# Patient Record
Sex: Male | Born: 1950
Health system: Southern US, Community
[De-identification: ages and names within clinical notes are randomized; demographics above are authoritative.]

## PROBLEM LIST (undated history)

## (undated) DIAGNOSIS — F32A Depression, unspecified: Secondary | ICD-10-CM

## (undated) DIAGNOSIS — M109 Gout, unspecified: Secondary | ICD-10-CM

## (undated) DIAGNOSIS — G4730 Sleep apnea, unspecified: Secondary | ICD-10-CM

## (undated) DIAGNOSIS — E669 Obesity, unspecified: Secondary | ICD-10-CM

## (undated) DIAGNOSIS — H269 Unspecified cataract: Secondary | ICD-10-CM

## (undated) DIAGNOSIS — K219 Gastro-esophageal reflux disease without esophagitis: Secondary | ICD-10-CM

## (undated) DIAGNOSIS — I340 Nonrheumatic mitral (valve) insufficiency: Secondary | ICD-10-CM

## (undated) DIAGNOSIS — E785 Hyperlipidemia, unspecified: Secondary | ICD-10-CM

## (undated) DIAGNOSIS — F329 Major depressive disorder, single episode, unspecified: Secondary | ICD-10-CM

## (undated) DIAGNOSIS — K279 Peptic ulcer, site unspecified, unspecified as acute or chronic, without hemorrhage or perforation: Secondary | ICD-10-CM

## (undated) DIAGNOSIS — B0050 Herpesviral ocular disease, unspecified: Secondary | ICD-10-CM

## (undated) DIAGNOSIS — R011 Cardiac murmur, unspecified: Secondary | ICD-10-CM

## (undated) DIAGNOSIS — K449 Diaphragmatic hernia without obstruction or gangrene: Secondary | ICD-10-CM

## (undated) DIAGNOSIS — M5430 Sciatica, unspecified side: Secondary | ICD-10-CM

## (undated) DIAGNOSIS — T7840XA Allergy, unspecified, initial encounter: Secondary | ICD-10-CM

## (undated) DIAGNOSIS — M1990 Unspecified osteoarthritis, unspecified site: Secondary | ICD-10-CM

## (undated) DIAGNOSIS — I10 Essential (primary) hypertension: Secondary | ICD-10-CM

## (undated) HISTORY — DX: Unspecified osteoarthritis, unspecified site: M19.90

## (undated) HISTORY — DX: Herpesviral ocular disease, unspecified: B00.50

## (undated) HISTORY — DX: Major depressive disorder, single episode, unspecified: F32.9

## (undated) HISTORY — DX: Sleep apnea, unspecified: G47.30

## (undated) HISTORY — PX: COLONOSCOPY: SHX174

## (undated) HISTORY — DX: Diaphragmatic hernia without obstruction or gangrene: K44.9

## (undated) HISTORY — DX: Gout, unspecified: M10.9

## (undated) HISTORY — DX: Hyperlipidemia, unspecified: E78.5

## (undated) HISTORY — DX: Depression, unspecified: F32.A

## (undated) HISTORY — DX: Obesity, unspecified: E66.9

## (undated) HISTORY — DX: Peptic ulcer, site unspecified, unspecified as acute or chronic, without hemorrhage or perforation: K27.9

## (undated) HISTORY — DX: Allergy, unspecified, initial encounter: T78.40XA

## (undated) HISTORY — DX: Sciatica, unspecified side: M54.30

## (undated) HISTORY — DX: Cardiac murmur, unspecified: R01.1

## (undated) HISTORY — DX: Unspecified cataract: H26.9

## (undated) HISTORY — DX: Essential (primary) hypertension: I10

## (undated) HISTORY — DX: Gastro-esophageal reflux disease without esophagitis: K21.9

## (undated) HISTORY — PX: POLYPECTOMY: SHX149

## (undated) HISTORY — DX: Nonrheumatic mitral (valve) insufficiency: I34.0

---

## 2001-03-07 ENCOUNTER — Emergency Department (HOSPITAL_COMMUNITY): Admission: EM | Admit: 2001-03-07 | Discharge: 2001-03-08 | Payer: Self-pay | Admitting: Emergency Medicine

## 2002-09-20 HISTORY — PX: MITRAL VALVE REPAIR: SHX2039

## 2002-10-19 ENCOUNTER — Inpatient Hospital Stay (HOSPITAL_COMMUNITY): Admission: EM | Admit: 2002-10-19 | Discharge: 2002-11-06 | Payer: Self-pay | Admitting: Emergency Medicine

## 2002-10-19 ENCOUNTER — Encounter: Payer: Self-pay | Admitting: Internal Medicine

## 2002-10-20 ENCOUNTER — Encounter: Payer: Self-pay | Admitting: Internal Medicine

## 2002-10-30 ENCOUNTER — Encounter: Payer: Self-pay | Admitting: Cardiothoracic Surgery

## 2002-10-31 ENCOUNTER — Encounter: Payer: Self-pay | Admitting: Cardiothoracic Surgery

## 2002-11-01 ENCOUNTER — Encounter: Payer: Self-pay | Admitting: Cardiothoracic Surgery

## 2002-11-05 ENCOUNTER — Encounter: Payer: Self-pay | Admitting: Cardiothoracic Surgery

## 2002-12-10 ENCOUNTER — Encounter (HOSPITAL_COMMUNITY): Admission: RE | Admit: 2002-12-10 | Discharge: 2003-03-10 | Payer: Self-pay | Admitting: Cardiology

## 2004-09-20 HISTORY — PX: KNEE ARTHROSCOPY: SUR90

## 2010-05-29 DIAGNOSIS — J309 Allergic rhinitis, unspecified: Secondary | ICD-10-CM | POA: Insufficient documentation

## 2011-05-28 ENCOUNTER — Encounter: Payer: Self-pay | Admitting: Internal Medicine

## 2011-12-08 ENCOUNTER — Encounter: Payer: Self-pay | Admitting: *Deleted

## 2012-02-18 ENCOUNTER — Encounter: Payer: Self-pay | Admitting: Cardiology

## 2012-06-21 ENCOUNTER — Encounter: Payer: Self-pay | Admitting: Internal Medicine

## 2013-01-24 ENCOUNTER — Encounter: Payer: Self-pay | Admitting: Cardiology

## 2014-08-07 ENCOUNTER — Encounter: Payer: Self-pay | Admitting: Internal Medicine

## 2014-09-18 ENCOUNTER — Ambulatory Visit (AMBULATORY_SURGERY_CENTER): Payer: Self-pay | Admitting: *Deleted

## 2014-09-18 VITALS — Ht 67.0 in | Wt 248.0 lb

## 2014-09-18 DIAGNOSIS — Z1211 Encounter for screening for malignant neoplasm of colon: Secondary | ICD-10-CM

## 2014-09-18 MED ORDER — MOVIPREP 100 G PO SOLR: 1.0000 | Freq: Once | ORAL | Status: DC

## 2014-09-18 NOTE — Progress Notes (Signed)
No egg or soy allergy. No anesthesia problems.  No home O2.  No diet meds.  

## 2014-10-02 ENCOUNTER — Encounter: Payer: Self-pay | Admitting: Internal Medicine

## 2014-10-02 ENCOUNTER — Ambulatory Visit (AMBULATORY_SURGERY_CENTER): Payer: BLUE CROSS/BLUE SHIELD | Admitting: Internal Medicine

## 2014-10-02 VITALS — BP 123/85 | HR 68 | Temp 97.8°F | Resp 15 | Ht 67.0 in | Wt 248.0 lb

## 2014-10-02 DIAGNOSIS — D122 Benign neoplasm of ascending colon: Secondary | ICD-10-CM

## 2014-10-02 DIAGNOSIS — D123 Benign neoplasm of transverse colon: Secondary | ICD-10-CM

## 2014-10-02 DIAGNOSIS — D124 Benign neoplasm of descending colon: Secondary | ICD-10-CM

## 2014-10-02 DIAGNOSIS — Z1211 Encounter for screening for malignant neoplasm of colon: Secondary | ICD-10-CM

## 2014-10-02 MED ORDER — SODIUM CHLORIDE 0.9 % IV SOLN: 500.0000 mL | INTRAVENOUS | Status: DC

## 2014-10-02 NOTE — Op Note (Signed)
[place]  [address] [employer] [address]. [place], [address]   COLONOSCOPY PROCEDURE REPORT  PATIENT: Luis Boone, Luis Boone  MR#: [id number] BIRTHDATE: Feb 25, 1951 , 20  yrs. old GENDER: male ENDOSCOPIST: Eustace Quail, MD REFERRED NG:[id number] Recall, PROCEDURE DATE:  10/02/2014 PROCEDURE:   Colonoscopy with snare polypectomy x 7 First Screening Colonoscopy - Avg.  risk and is 50 yrs.  old or older - No.  Prior Negative Screening - Now for repeat screening. 10 or more years since last screening  History of Adenoma - Now for follow-up colonoscopy & has been > or = to 3 yrs.  N/A  Polyps Removed Today? Yes. ASA CLASS:   Class II INDICATIONS:average risk for colorectal cancer. . Index exam August 2005 was normal MEDICATIONS: Monitored anesthesia care and Propofol 300 mg IV DESCRIPTION OF PROCEDURE:   After the risks benefits and alternatives of the procedure were thoroughly explained, informed consent was obtained.  The digital rectal exam revealed no abnormalities of the rectum.   The [id number]  endoscope was introduced through the anus and advanced to the cecum, which was identified by both the appendix and ileocecal valve. No adverse events experienced.   The quality of the prep was excellent, using MoviPrep  The instrument was then slowly withdrawn as the colon was fully examined.  COLON FINDINGS: Seven polyps were found in the descending colon, transverse colon, and ascending colon.  A polypectomy was performed with a cold snare.  The resection was complete, the polyp tissue was completely retrieved and sent to histology.   The examination was otherwise normal.  Retroflexed views revealed internal hemorrhoids. The time to cecum=3 minutes 46 seconds.  Withdrawal time=17 minutes 58 seconds.  The scope was withdrawn and the procedure completed. COMPLICATIONS: There were no immediate complications. ENDOSCOPIC IMPRESSION: 1.   Seven polyps were found in the colon;  polypectomy was performed with a cold snare 2.   The examination was otherwise normal RECOMMENDATIONS: 1. Repeat Colonoscopy in 3 years.  eSigned:  Eustace Quail, MD 10/02/2014 1:59 PM cc: Prince Solian, MD and The Patient   PATIENT NAME:  Luis Boone, Luis Boone MR#: [id number]

## 2014-10-02 NOTE — Progress Notes (Signed)
Called to room to assist during endoscopic procedure.  Patient ID and intended procedure confirmed with present staff. Received instructions for my participation in the procedure from the performing physician.  

## 2014-10-02 NOTE — Progress Notes (Signed)
Report to PACU, RN, vss, BBS= Clear.  

## 2014-10-02 NOTE — Progress Notes (Signed)
No problems noted in the recovery room. maw 

## 2014-10-02 NOTE — Patient Instructions (Signed)

## 2014-10-03 ENCOUNTER — Telehealth: Payer: Self-pay | Admitting: *Deleted

## 2014-10-03 NOTE — Telephone Encounter (Signed)
  Follow up Call-  Call back number 10/02/2014  Post procedure Call Back phone  # [phone]  Permission to leave phone message Yes    [facility]

## 2014-10-08 ENCOUNTER — Encounter: Payer: Self-pay | Admitting: Internal Medicine

## 2016-08-31 ENCOUNTER — Encounter: Payer: Self-pay | Admitting: Neurology

## 2016-08-31 ENCOUNTER — Ambulatory Visit (INDEPENDENT_AMBULATORY_CARE_PROVIDER_SITE_OTHER): Payer: BLUE CROSS/BLUE SHIELD | Admitting: Neurology

## 2016-08-31 VITALS — BP 117/80 | HR 79 | Resp 20 | Ht 67.0 in | Wt 258.0 lb

## 2016-08-31 DIAGNOSIS — R0681 Apnea, not elsewhere classified: Secondary | ICD-10-CM

## 2016-08-31 DIAGNOSIS — R400 Somnolence: Secondary | ICD-10-CM | POA: Diagnosis not present

## 2016-08-31 DIAGNOSIS — G4733 Obstructive sleep apnea (adult) (pediatric): Secondary | ICD-10-CM | POA: Diagnosis not present

## 2016-08-31 DIAGNOSIS — R351 Nocturia: Secondary | ICD-10-CM

## 2016-08-31 DIAGNOSIS — R51 Headache: Secondary | ICD-10-CM

## 2016-08-31 DIAGNOSIS — R519 Headache, unspecified: Secondary | ICD-10-CM

## 2016-08-31 DIAGNOSIS — G478 Other sleep disorders: Secondary | ICD-10-CM

## 2016-08-31 DIAGNOSIS — R0683 Snoring: Secondary | ICD-10-CM

## 2016-08-31 NOTE — Progress Notes (Signed)
Subjective:    Patient ID: Luis Boone is a 65 y.o. male.  HPI     Star Age, MD, PhD [facility] [address] [place], [place] [address]  Dear Dr. Dagmar Hait,   I saw your patient, Luis Boone, upon your kind request in my neurologic clinic today for initial consultation of his sleep disorder, in particular, concern for underlying obstructive sleep apnea. The patient is unaccompanied today. As you know, Luis Boone is a 65 year old right-handed gentleman with an underlying medical history of hypertension, hyperlipidemia, hiatal hernia, peptic ulcer disease, depression, gout, mitral valve repair, arthritis and status post arthroscopic knee surgeries, as well as obesity, who reports snoring and excessive daytime somnolence as well as morning headaches. I reviewed your office note from 08/26/2016, which you kindly included. 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 He has 2 grown children (one adopted, one biological). He does not smoke or drink alcohol. He is going to retire by the end of this year. Bedtime is about 10:00 to 10:30 PM, and likes to read on his [place], is asleep latest by 11 PM.  His Rise time is 5:15 AM. He does not always wake up rested. He has had fairly frequent morning headaches in the past few months, snoring has become worse in the past 12-18 months and he has also been noted to sleep talking the past 12 months per wife's report. His Epworth sleepiness score is 15 out of 24 today, his fatigue score is 41 out of 63. His sister may have obstructive sleep apnea and uses a CPAP machine as far as he can recall. He would be willing to try CPAP therapy. He denies restless legs symptoms and is not sure if he twitches or kicks his legs in his sleep. He has nocturia about 2-3 times per average night. Weight has been fluctuating. He is working on weight loss. His dentist made him aware that his airway is crowded  and small and that he may need to be tested for sleep apnea. He had recent blood work in your office on 08/19/2016 and I reviewed the results. PSA was 0.502, lipid profile was good with total cholesterol at 114, LDL 55, HDL 44, liver function test normal, BUN and creatinine normal.  He is a nonsmoker, does not drink alcohol, drinks caffeine in limitation, usually one per day in the morning in the form of coffee.   His Past Medical History Is Significant For: Past Medical History:  Diagnosis Date  . Allergy    seasonal  . Arthritis   . Depression   . GERD (gastroesophageal reflux disease)   . Gout   . Gout   . Heart murmur    heart valve repair  . Hiatal hernia   . HLD (hyperlipidemia)   . Hypertension   . Mitral murmur   . Obesity   . Ocular herpes simplex   . PUD (peptic ulcer disease)     His Past Surgical History Is Significant For: Past Surgical History:  Procedure Laterality Date  . COLONOSCOPY    . KNEE ARTHROSCOPY Right 2006  . MITRAL VALVE REPAIR  2004    His Family History Is Significant For: Family History  Problem Relation Age of Onset  . Migraines Mother   . Hypertension Mother   . Dementia Mother   . Cancer Father   . Arthritis Father   . Heart attack Father   .  Colon cancer Neg Hx     His Social History Is Significant For: Social History   Social History  . Marital status: Unknown    Spouse name: N/A  . Number of children: N/A  . Years of education: N/A   Social History Main Topics  . Smoking status: Never Smoker  . Smokeless tobacco: Never Used  . Alcohol use No  . Drug use: No  . Sexual activity: Not Asked   Other Topics Concern  . None   Social History Narrative  . None    His Allergies Are:  Allergies  Allergen Reactions  . Prilosec [Omeprazole] Rash  . Vicodin [Hydrocodone-Acetaminophen] Rash  . Other     Medication to sedate him for a colonoscopy.  :   His Current Medications Are:  Outpatient Encounter Prescriptions as  of 08/31/2016  Medication Sig  . allopurinol (ZYLOPRIM) 100 MG tablet Take 100 mg by mouth daily.  Marland Kitchen aspirin 81 MG tablet Take 81 mg by mouth daily.  Marland Kitchen atorvastatin (LIPITOR) 40 MG tablet Take 40 mg by mouth daily.  . colchicine (COLCRYS) 0.6 MG tablet Take 0.6 mg by mouth daily.  Marland Kitchen loratadine (CLARITIN) 10 MG tablet Take 10 mg by mouth daily.  . metoprolol succinate (TOPROL-XL) 25 MG 24 hr tablet Take 25 mg by mouth daily.  . [DISCONTINUED] montelukast (SINGULAIR) 10 MG tablet Take 10 mg by mouth at bedtime.  . [DISCONTINUED] omega-3 acid ethyl esters (LOVAZA) 1 G capsule Take 1 g by mouth daily.   No facility-administered encounter medications on file as of 08/31/2016.   :  Review of Systems:  Out of a complete 14 point review of systems, all are reviewed and negative with the exception of these symptoms as listed below:  Review of Systems  Neurological: Positive for headaches.       Pt presents today to discuss the possibility of having a sleep study. Pt says that both his dentist and Dr. Dagmar Hait recommended that he have a sleep study. Pt reports snoring but has never had a sleep study.  Epworth Sleepiness Scale 0= would never doze 1= slight chance of dozing 2= moderate chance of dozing 3= high chance of dozing  Sitting and reading: 2 Watching TV: 3 Sitting inactive in a public place (ex. Theater or meeting): 2 As a passenger in a car for an hour without a break: 1 Lying down to rest in the afternoon: 3 Sitting and talking to someone: 1 Sitting quietly after lunch (no alcohol): 2 In a car, while stopped in traffic: 1 Total: 15   Psychiatric/Behavioral: Positive for sleep disturbance.    Objective:  Neurologic Exam  Physical Exam Physical Examination:   Vitals:   08/31/16 1614  BP: 117/80  Pulse: 79  Resp: 20   General Examination: The patient is a very pleasant 65 y.o. male in no acute distress. He appears well-developed and well-nourished and well groomed.    HEENT: Normocephalic, atraumatic, pupils are equal, round and reactive to light and accommodation. Funduscopic exam is normal with sharp disc margins noted. Extraocular tracking is good without limitation to gaze excursion or nystagmus noted. Normal smooth pursuit is noted. Hearing is grossly intact. Face is symmetric with normal facial animation and normal facial sensation. Speech is clear with no dysarthria noted. There is no hypophonia. There is no lip, neck/head, jaw or voice tremor. Neck is supple with full range of passive and active motion. There are no carotid bruits on auscultation. Oropharynx exam reveals: mild mouth  dryness, adequate dental hygiene and marked airway crowding, due to small airway and thicker tongue, large uvula, tonsils about 1+, could not fully visualize. Mallampati is class III. Tongue protrudes centrally and palate elevates symmetrically. Neck size is 18.5 inches. He has a Marked overbite. Nasal inspection reveals no significant nasal mucosal bogginess or redness and no septal deviation.   Chest: Clear to auscultation without wheezing, rhonchi or crackles noted.  Heart: S1+S2+0, regular and normal without murmurs, rubs or gallops noted.   Abdomen: Soft, non-tender and non-distended with normal bowel sounds appreciated on auscultation.  Extremities: There is no pitting edema in the distal lower extremities bilaterally. Pedal pulses are intact.  Skin: Warm and dry without trophic changes noted. There are no varicose veins.  Musculoskeletal: exam reveals no obvious joint deformities, tenderness or joint swelling or erythema.   Neurologically:  Mental status: The patient is awake, alert and oriented in all 4 spheres. His immediate and remote memory, attention, language skills and fund of knowledge are appropriate. There is no evidence of aphasia, agnosia, apraxia or anomia. Speech is clear with normal prosody and enunciation. Thought process is linear. Mood is normal and  affect is normal.  Cranial nerves II - XII are as described above under HEENT exam. In addition: shoulder shrug is normal with equal shoulder height noted. Motor exam: Normal bulk, strength and tone is noted. There is no drift, tremor or rebound. Romberg is negative. Reflexes are 2+ throughout. Fine motor skills and coordination: intact with normal finger taps, normal hand movements, normal rapid alternating patting, normal foot taps and normal foot agility.  Cerebellar testing: No dysmetria or intention tremor on finger to nose testing. Heel to shin is unremarkable bilaterally. There is no truncal or gait ataxia.  Sensory exam: intact to light touch, pinprick, vibration, temperature sense in the upper and lower extremities.  Gait, station and balance: He stands easily. No veering to one side is noted. No leaning to one side is noted. Posture is age-appropriate and stance is narrow based. Gait shows normal stride length and normal pace. No problems turning are noted. Tandem walk is unremarkable.   Assessment and Plan:   In summary, Luis Boone is a very pleasant 65 y.o.-year old male with a history and physical exam concerning for obstructive sleep apnea (OSA). I had a long chat with the patient about my findings and the diagnosis of OSA, its prognosis and treatment options. We talked about medical treatments, surgical interventions and non-pharmacological approaches. I explained in particular the risks and ramifications of untreated moderate to severe OSA, especially with respect to developing cardiovascular disease down the Road, including congestive heart failure, difficult to treat hypertension, cardiac arrhythmias, or stroke. Even type 2 diabetes has, in part, been linked to untreated OSA. Symptoms of untreated OSA include daytime sleepiness, memory problems, mood irritability and mood disorder such as depression and anxiety, lack of energy, as well as recurrent headaches, especially morning  headaches. We talked about trying to maintain a healthy lifestyle in general, as well as the importance of weight control. I encouraged the patient to eat healthy, exercise daily and keep well hydrated, to keep a scheduled bedtime and wake time routine, to not skip any meals and eat healthy snacks in between meals. I advised the patient not to drive when feeling sleepy. I recommended the following at this time: sleep study with potential positive airway pressure titration. (We will score hypopneas at 3% and split the sleep study into diagnostic and treatment portion,  if the estimated. 2 hour AHI is >15/h).   I explained the sleep test procedure to the patient and also outlined possible surgical and non-surgical treatment options of OSA, including the use of a custom-made dental device (which would require a referral to a specialist dentist or oral surgeon), upper airway surgical options, such as pillar implants, radiofrequency surgery, tongue base surgery, and UPPP (which would involve a referral to an ENT surgeon). Rarely, jaw surgery such as mandibular advancement may be considered.  I also explained the CPAP treatment option to the patient, who indicated that he would be willing to try CPAP if the need arises. I explained the importance of being compliant with PAP treatment, not only for insurance purposes but primarily to improve His symptoms, and for the patient's long term health benefit, including to reduce His cardiovascular risks. I answered all his questions today and the patient was in agreement. I would like to see him back after the sleep study is completed and encouraged him to call with any interim questions, concerns, problems or updates.   Thank you very much for allowing me to participate in the care of this nice patient. If I can be of any further assistance to you please do not hesitate to call me at [phone].   Sincerely,   Star Age, MD, PhD

## 2016-08-31 NOTE — Patient Instructions (Signed)

## 2016-09-29 ENCOUNTER — Telehealth: Payer: Self-pay | Admitting: Neurology

## 2016-09-29 DIAGNOSIS — G4733 Obstructive sleep apnea (adult) (pediatric): Secondary | ICD-10-CM

## 2016-09-29 DIAGNOSIS — R0681 Apnea, not elsewhere classified: Secondary | ICD-10-CM

## 2016-09-29 DIAGNOSIS — R351 Nocturia: Secondary | ICD-10-CM

## 2016-09-29 DIAGNOSIS — R51 Headache: Secondary | ICD-10-CM

## 2016-09-29 DIAGNOSIS — R0683 Snoring: Secondary | ICD-10-CM

## 2016-09-29 DIAGNOSIS — R519 Headache, unspecified: Secondary | ICD-10-CM

## 2016-09-29 DIAGNOSIS — G478 Other sleep disorders: Secondary | ICD-10-CM

## 2016-09-29 DIAGNOSIS — R400 Somnolence: Secondary | ICD-10-CM

## 2016-09-29 NOTE — Telephone Encounter (Signed)
Order placed

## 2016-09-29 NOTE — Telephone Encounter (Signed)
[employer] denied Split.  Can I get an order for HST? °

## 2016-10-01 DIAGNOSIS — N183 Chronic kidney disease, stage 3 (moderate): Secondary | ICD-10-CM | POA: Diagnosis not present

## 2016-10-01 DIAGNOSIS — I10 Essential (primary) hypertension: Secondary | ICD-10-CM | POA: Diagnosis not present

## 2016-10-01 DIAGNOSIS — E669 Obesity, unspecified: Secondary | ICD-10-CM | POA: Diagnosis not present

## 2016-10-01 DIAGNOSIS — Z9889 Other specified postprocedural states: Secondary | ICD-10-CM | POA: Diagnosis not present

## 2016-11-01 DIAGNOSIS — H524 Presbyopia: Secondary | ICD-10-CM | POA: Diagnosis not present

## 2016-11-01 DIAGNOSIS — H2513 Age-related nuclear cataract, bilateral: Secondary | ICD-10-CM | POA: Diagnosis not present

## 2016-11-01 DIAGNOSIS — H1789 Other corneal scars and opacities: Secondary | ICD-10-CM | POA: Diagnosis not present

## 2016-11-01 DIAGNOSIS — H25012 Cortical age-related cataract, left eye: Secondary | ICD-10-CM | POA: Diagnosis not present

## 2016-11-08 ENCOUNTER — Telehealth: Payer: Self-pay | Admitting: Neurology

## 2016-11-08 DIAGNOSIS — G478 Other sleep disorders: Secondary | ICD-10-CM

## 2016-11-08 DIAGNOSIS — G4733 Obstructive sleep apnea (adult) (pediatric): Secondary | ICD-10-CM

## 2016-11-08 DIAGNOSIS — R0681 Apnea, not elsewhere classified: Secondary | ICD-10-CM

## 2016-11-08 DIAGNOSIS — R400 Somnolence: Secondary | ICD-10-CM

## 2016-11-08 DIAGNOSIS — R51 Headache: Secondary | ICD-10-CM

## 2016-11-08 DIAGNOSIS — R519 Headache, unspecified: Secondary | ICD-10-CM

## 2016-11-08 DIAGNOSIS — R351 Nocturia: Secondary | ICD-10-CM

## 2016-11-08 DIAGNOSIS — R0683 Snoring: Secondary | ICD-10-CM

## 2016-11-08 NOTE — Telephone Encounter (Signed)
There is already an order for an HST from 09/29/2016 since the split night was denied.

## 2016-11-08 NOTE — Telephone Encounter (Signed)
I spoke to [facility]. She reports that pt changed insurances and now they will only approve a split night. Will re-order the original split night order, per Dr. Guadelupe Sabin order.

## 2016-11-08 NOTE — Telephone Encounter (Signed)
Patient called to schedule but I don't have an order in my workqueue.  I did read the notes and scheduled the patient for a split.  Can I get an order for a sleep study.

## 2016-11-14 ENCOUNTER — Ambulatory Visit (INDEPENDENT_AMBULATORY_CARE_PROVIDER_SITE_OTHER): Payer: PPO | Admitting: Neurology

## 2016-11-14 DIAGNOSIS — G4733 Obstructive sleep apnea (adult) (pediatric): Secondary | ICD-10-CM | POA: Diagnosis not present

## 2016-11-14 DIAGNOSIS — G4720 Circadian rhythm sleep disorder, unspecified type: Secondary | ICD-10-CM

## 2016-11-15 ENCOUNTER — Telehealth: Payer: Self-pay

## 2016-11-15 NOTE — Addendum Note (Signed)
Addended by: Star Age on: 11/15/2016 08:30 AM   Modules accepted: Orders

## 2016-11-15 NOTE — Telephone Encounter (Signed)
I called pt. I advised him of his sleep study results. Pt is agreeable to starting a cpap at home. I advised pt that I will send the order for cpap to a DME, [facility], and they will call him to get his cpap set up. I reviewed cpap compliance expectations with the pt. A follow up appt was made with the pt for 02/01/2017 at 2:00pm. Pt verbalized understanding of results. Pt had no questions at this time but was encouraged to call back if questions arise.

## 2016-11-15 NOTE — Telephone Encounter (Signed)
-----   Message from Star Age, MD sent at 11/15/2016  8:29 AM EST ----- Luis Boone:  Patient referred by Dr. Dagmar Hait, seen by me on 08/31/16, split study on 11/14/16. Please call and notify patient that the recent sleep study confirmed the diagnosis of severe OSA. He did well with CPAP during the study with significant improvement of the respiratory events. Therefore, I would like start the patient on CPAP therapy at home by prescribing a machine for home use. I placed the order in the chart. The patient will need a follow up appointment with me in 8 to 10 weeks post set up that has to be scheduled; please go ahead and schedule while you have the patient on the phone and make sure patient understands the importance of keeping this window for the FU appointment, as it is often an insurance requirement and failing to adhere to this may result in losing coverage for sleep apnea treatment.  Please re-enforce the importance of compliance with treatment and the need for [place] to monitor compliance data - again an insurance requirement and good feedback for the patient as far as how they are doing.  Also remind patient, that any upcoming CPAP machine or mask issues, should be first addressed with the DME company. Please ask if patient has a preference regarding DME company.  Please arrange for CPAP set up at home through a DME company of patient's choice - once you have spoken to the patient - and faxed/routed report to PCP and referring MD (if other than PCP), you can close this encounter, thanks,   Star Age, MD, PhD [facility] ([place] [address])

## 2016-11-15 NOTE — Procedures (Signed)
PATIENT'S NAME:  Luis Boone, Luis Boone DOB:      20-Oct-1950      MR#:    [id number]     DATE OF RECORDING: 11/14/2016 REFERRING M.D.:  Michaell Cowing, MD Study Performed:  Split-Night Titration Study HISTORY: 66 year old man with a history of hypertension, hyperlipidemia, hiatal hernia, peptic ulcer disease, depression, gout, mitral valve repair, arthritis and status post arthroscopic knee surgeries, as well as obesity, who reports snoring and excessive daytime somnolence as well as morning headaches. His Epworth sleepiness score is 15 out of 24, his fatigue score is 41 out of 63. Patient's weight 258 pounds with a height of 67 (inches), resulting in a BMI of 40.5 kg/m2. The patient's neck circumference measured 18.5 inches.  CURRENT MEDICATIONS: Zyloprim, ASA 81mg , Lipitor, Colcrys, Claritin, Toprol-XL,   PROCEDURE:  This is a multichannel digital polysomnogram utilizing the Somnostar 11.2 system.  Electrodes and sensors were applied and monitored per AASM Specifications.   EEG, EOG, Chin and Limb EMG, were sampled at 200 Hz.  ECG, Snore and Nasal Pressure, Thermal Airflow, Respiratory Effort, CPAP Flow and Pressure, Oximetry was sampled at 50 Hz. Digital video and audio were recorded.      BASELINE STUDY WITHOUT CPAP RESULTS:  Lights Out was at 20:57 and Lights On at 04:32 for the night.  Total recording time (TRT) was 167, with a total sleep time (TST) of 122.5 minutes.   The patient's sleep latency was 18.5 minutes.  REM was absent. The sleep efficiency was 73.4 %.    SLEEP ARCHITECTURE: WASO (Wake after sleep onset) was 17.5 minutes with moderate sleep fragmentation noted, Stage N1 was 65.5 minutes, Stage N2 was 57 minutes, Stage N3 was 0 minutes and Stage R (REM sleep) was 0 minutes.  The percentages were Stage N1 53.5%, which is highly increased, Stage N2 46.5%, which is normal, Stage N3 and Stage R were absent.    The arousals were noted as: 5 were spontaneous, 0 were associated with PLMs, 87 were  associated with respiratory events.   Audio and video analysis did not show any abnormal or unusual movements, behaviors, phonations or vocalizations. He struggled with the CPAP nasal mask and slept better with the full face mask.  The patient took 1 bathroom break for the night. Mild to moderate snoring was noted. The EKG was in keeping with normal sinus rhythm (NSR)  RESPIRATORY ANALYSIS:  There were a total of 120 respiratory events:  61 obstructive apneas, 0 central apneas and 0 mixed apneas with a total of 61 apneas and an apnea index (AI) of 29.9. There were 59 hypopneas with a hypopnea index of 28.9. The patient also had 0 respiratory event related arousals (RERAs).  Snoring was noted.     The total APNEA/HYPOPNEA INDEX (AHI) was 58.8 /hour and the total RESPIRATORY DISTURBANCE INDEX was 58.8 /hour.  0 events occurred in REM sleep and 118 events in NREM. The REM AHI was 0, /hour versus a non-REM AHI of 58.8 /hour. The patient spent 21.5 minutes sleep time in the supine position 300 minutes in non-supine. The supine AHI was 0.0 /hour versus a non-supine AHI of 59.2 /hour.  OXYGEN SATURATION & C02:  The wake baseline 02 saturation was 92%, with the lowest being 85%. Time spent below 89% saturation equaled 6 minutes.  PERIODIC LIMB MOVEMENTS:    The patient had a total of 0 Periodic Limb Movements.  The Periodic Limb Movement (PLM) index was 0 /hour and the PLM Arousal index was 0 /  hour.  TITRATION STUDY WITH CPAP RESULTS:   The patient was fitted with a nasal mask, but had trouble maintaining sleep and did better with a FFM (medium Simplus), but woke up with a mark on his nose from the mask and a different style FFM is recommend. CPAP was initiated at 5 cmH20 with heated humidity per AASM split night standards and pressure was advanced to 15 cmH20 because of hypopneas, apneas and desaturations and snoring.  At a PAP pressure of 15 cmH20, there was a reduction of the AHI to 0/hour, O2 nadir of  95% and supine NREM sleep achieved.   Total recording time (TRT) was 289 minutes, with a total sleep time (TST) of 199 minutes. The patient's sleep latency was 12 minutes. REM latency was 205 minutes.  The sleep efficiency was 68.9 %.    SLEEP ARCHITECTURE: Wake after sleep was 82.5 minutes, Stage N1 35.5 minutes, Stage N2 87.5 minutes, Stage N3 60.5 minutes and Stage R (REM sleep) 15.5 minutes. The percentages were: Stage N1 17.8%, which is increased, Stage N2 44.%, Stage N3 30.4%, which is increased, and Stage R (REM sleep) 7.8%, which is reduced.   The arousals were noted as: 9 were spontaneous, 0 were associated with PLMs, 13 were associated with respiratory events.  RESPIRATORY ANALYSIS:  There were a total of 28 respiratory events: 2 obstructive apneas, 0 central apneas and 0 mixed apneas with a total of 2 apneas and an apnea index (AI) of .6. There were 26 hypopneas with a hypopnea index of 7.8 /hour. The patient also had 0 respiratory event related arousals (RERAs).      The total APNEA/HYPOPNEA INDEX  (AHI) was 8.4 /hour and the total RESPIRATORY DISTURBANCE INDEX was 8.4 /hour.  5 events occurred in REM sleep and 23 events in NREM. The REM AHI was 19.4 /hour versus a non-REM AHI of 7.5 /hour. REM sleep was achieved on a pressure of  cm/h2o (AHI was  .) The patient spent 10% of total sleep time in the supine position. The supine AHI was 2.9 /hour, versus a non-supine AHI of 9.1/hour.  OXYGEN SATURATION & C02:  The wake baseline 02 saturation was 95%, with the lowest being 91%. Time spent below 89% saturation equaled 0 minutes.  PERIODIC LIMB MOVEMENTS:    The patient had a total of 0 Periodic Limb Movements. The Periodic Limb Movement (PLM) index was 0 /hour and the PLM Arousal index was 0 /hour.   Post-study, the patient indicated that sleep was worse than usual (but better with the FFM).  POLYSOMNOGRAPHY IMPRESSION :   1. Obstructive Sleep Apnea(OSA)  2. Dysfunctions associated with  sleep stages or arousals from sleep  RECOMMENDATIONS:  1. This patient has severe obstructive sleep apnea and responded well on CPAP therapy. I will, therefore, start the patient on home CPAP treatment at a pressure of 15 cm via FFM, with heated humidity. The patient should be reminded to be fully compliant with PAP therapy to improve sleep related symptoms and decrease long term cardiovascular risks. Please note that untreated obstructive sleep apnea carries additional perioperative morbidity. Patients with significant obstructive sleep apnea should receive perioperative PAP therapy and the surgeons and particularly the anesthesiologist should be informed of the diagnosis and the severity of the sleep disordered breathing. 2. This study shows sleep fragmentation and abnormal sleep stage percentages; these are nonspecific findings and per se do not signify an intrinsic sleep disorder or a cause for the patient's sleep-related symptoms. Causes include (  but are not limited to) the first night effect of the sleep study, circadian rhythm disturbances, medication effect or an underlying mood disorder or medical problem.  3. The patient should be cautioned not to drive, work at heights, or operate dangerous or heavy equipment when tired or sleepy. Review and reiteration of good sleep hygiene measures should be pursued with any patient. 4. The patient will be seen in follow-up by Dr. Rexene Alberts at [facility] for discussion of the test results and further management strategies. The referring provider will be notified of the test results.  I certify that I have reviewed the entire raw data recording prior to the issuance of this report in accordance with the Standards of Accreditation of the American Academy of Sleep Medicine (AASM)       Star Age, MD, PhD Diplomat, American Board of Psychiatry and Neurology (Neurology and Sleep Medicine)

## 2016-11-15 NOTE — Progress Notes (Signed)
Diana:  Patient referred by Dr. Dagmar Hait, seen by me on 08/31/16, split study on 11/14/16. Please call and notify patient that the recent sleep study confirmed the diagnosis of severe OSA. He did well with CPAP during the study with significant improvement of the respiratory events. Therefore, I would like start the patient on CPAP therapy at home by prescribing a machine for home use. I placed the order in the chart. The patient will need a follow up appointment with me in 8 to 10 weeks post set up that has to be scheduled; please go ahead and schedule while you have the patient on the phone and make sure patient understands the importance of keeping this window for the FU appointment, as it is often an insurance requirement and failing to adhere to this may result in losing coverage for sleep apnea treatment.  Please re-enforce the importance of compliance with treatment and the need for [place] to monitor compliance data - again an insurance requirement and good feedback for the patient as far as how they are doing.  Also remind patient, that any upcoming CPAP machine or mask issues, should be first addressed with the DME company. Please ask if patient has a preference regarding DME company.  Please arrange for CPAP set up at home through a DME company of patient's choice - once you have spoken to the patient - and faxed/routed report to PCP and referring MD (if other than PCP), you can close this encounter, thanks,   Star Age, MD, PhD [facility] ([place])

## 2016-12-22 DIAGNOSIS — G4733 Obstructive sleep apnea (adult) (pediatric): Secondary | ICD-10-CM | POA: Diagnosis not present

## 2017-01-21 DIAGNOSIS — G4733 Obstructive sleep apnea (adult) (pediatric): Secondary | ICD-10-CM | POA: Diagnosis not present

## 2017-02-01 ENCOUNTER — Ambulatory Visit (INDEPENDENT_AMBULATORY_CARE_PROVIDER_SITE_OTHER): Payer: PPO | Admitting: Neurology

## 2017-02-01 ENCOUNTER — Encounter (INDEPENDENT_AMBULATORY_CARE_PROVIDER_SITE_OTHER): Payer: Self-pay

## 2017-02-01 ENCOUNTER — Encounter: Payer: Self-pay | Admitting: Neurology

## 2017-02-01 VITALS — BP 128/72 | HR 78 | Resp 18 | Ht 67.0 in | Wt 262.0 lb

## 2017-02-01 DIAGNOSIS — G4733 Obstructive sleep apnea (adult) (pediatric): Secondary | ICD-10-CM

## 2017-02-01 DIAGNOSIS — Z9989 Dependence on other enabling machines and devices: Secondary | ICD-10-CM

## 2017-02-01 NOTE — Progress Notes (Signed)
Subjective:    Patient ID: Luis Boone is a 66 y.o. male.  HPI     Interim history:   Luis Boone is a 66 year old right-handed gentleman with an underlying medical history of hypertension, hyperlipidemia, hiatal hernia, peptic ulcer disease, depression, gout, mitral valve repair, arthritis and status post arthroscopic knee surgeries, as well as obesity, who presents for follow-up consultation of his obstructive sleep apnea, after recent sleep study testing. The patient is unaccompanied today. I first met him on 08/31/2016 at the request of his primary care physician, at which time the patient reported snoring, excessive daytime somnolence as well as morning headaches. I invited him for sleep study testing. He had a split-night sleep study on 11/14/2016. I went over his test results with him in detail today. Baseline sleep efficiency was 73.4%, sleep latency 18.5 minutes, REM was absent. He had no slow-wave sleep. Total AHI was 58.8 per hour, average oxygen saturation was 92%, nadir was 85%. No significant PLMS were noted. He was titrated on CPAP with a pressure of 5 cm to 15 cm. AHI was 0 per hour on the final pressure, O2 nadir of 95% and supine non-REM sleep was achieved. He achieved 30.4% of slow-wave sleep and REM sleep at 7.8% during the titration portion of the study. Average oxygen saturation was 94%, nadir was 91%. He had no significant PLMS during the second part of the study, no significant EKG changes. He was fitted with a medium full face mask which he preferred over the nasal mask. Based on his test results are prescribed CPAP therapy for home use.  Today, 02/01/2017 (all dictated new, as well as above notes, some dictation done in note pad or Word, outside of chart, may appear as copied):  I reviewed his CPAP compliance data from 01/01/2017 through 01/30/2017 which is a total of 30 days, during which time he used his CPAP every night with percent used days greater than 4 hours at 100%,  indicating superb compliance with an average usage of 7 hours and 5 minutes, residual AHI 4.2 per hour, leak acceptable for the 95th percentile at 8 L/m on a pressure of 15 cm with EPR of 3. He reports doing well, took some time to adjust her treatment but now is doing well in that regard. He feels improved in his sleep. Has gained weight since his retirement, working on weight loss, walks about an hour daily. Lives with wife and younger son. Feels that his sleep quality has improved, daytime sleepiness is much improved as well. He does not feel that he needs and take mandatory, it used to be working he would fall asleep inadvertently also sometimes several times per day and now he does not take any naps typically.  The patient's allergies, current medications, family history, past medical history, past social history, past surgical history and problem list were reviewed and updated as appropriate.   Previously (copied from previous notes for reference):   08/31/16: He reports snoring and excessive daytime somnolence as well as morning headaches. I reviewed your office note from 08/26/2016, which you kindly included. 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 He has 2 grown children (one adopted, one biological). He does not smoke or drink alcohol. He is going to retire by the end of this year. Bedtime is about 10:00 to 10:30 PM, and likes to read on his [place], is asleep latest by 11 PM.  His Rise time is 5:15  AM. He does not always wake up rested. He has had fairly frequent morning headaches in the past few months, snoring has become worse in the past 12-18 months and he has also been noted to sleep talking the past 12 months per wife's report. His Epworth sleepiness score is 15 out of 24 today, his fatigue score is 41 out of 63. His sister may have obstructive sleep apnea and uses a CPAP machine as far as he can recall. He would be willing to try CPAP therapy. He  denies restless legs symptoms and is not sure if he twitches or kicks his legs in his sleep. He has nocturia about 2-3 times per average night. Weight has been fluctuating. He is working on weight loss. His dentist made him aware that his airway is crowded and small and that he may need to be tested for sleep apnea. He had recent blood work in your office on 08/19/2016 and I reviewed the results. PSA was 0.502, lipid profile was good with total cholesterol at 114, LDL 55, HDL 44, liver function test normal, BUN and creatinine normal.  He is a nonsmoker, does not drink alcohol, drinks caffeine in limitation, usually one per day in the morning in the form of coffee.    His Past Medical History Is Significant For: Past Medical History:  Diagnosis Date  . Allergy    seasonal  . Arthritis   . Depression   . GERD (gastroesophageal reflux disease)   . Gout   . Gout   . Heart murmur    heart valve repair  . Hiatal hernia   . HLD (hyperlipidemia)   . Hypertension   . Mitral murmur   . Obesity   . Ocular herpes simplex   . PUD (peptic ulcer disease)     His Past Surgical History Is Significant For: Past Surgical History:  Procedure Laterality Date  . COLONOSCOPY    . KNEE ARTHROSCOPY Right 2006  . MITRAL VALVE REPAIR  2004    His Family History Is Significant For: Family History  Problem Relation Age of Onset  . Migraines Mother   . Hypertension Mother   . Dementia Mother   . Cancer Father   . Arthritis Father   . Heart attack Father   . Colon cancer Neg Hx     His Social History Is Significant For: Social History   Social History  . Marital status: Unknown    Spouse name: N/A  . Number of children: N/A  . Years of education: N/A   Social History Main Topics  . Smoking status: Never Smoker  . Smokeless tobacco: Never Used  . Alcohol use No  . Drug use: No  . Sexual activity: Not Asked   Other Topics Concern  . None   Social History Narrative  . None    His  Allergies Are:  Allergies  Allergen Reactions  . Prilosec [Omeprazole] Rash  . Vicodin [Hydrocodone-Acetaminophen] Rash  . Other     Medication to sedate him for a colonoscopy.  :   His Current Medications Are:  Outpatient Encounter Prescriptions as of 02/01/2017  Medication Sig  . allopurinol (ZYLOPRIM) 100 MG tablet Take 100 mg by mouth daily.  Marland Kitchen aspirin 81 MG tablet Take 81 mg by mouth daily.  Marland Kitchen atorvastatin (LIPITOR) 40 MG tablet Take 40 mg by mouth daily.  . clindamycin (CLEOCIN) 300 MG capsule   . loratadine (CLARITIN) 10 MG tablet Take 10 mg by mouth daily.  Marland Kitchen  metoprolol succinate (TOPROL-XL) 25 MG 24 hr tablet Take 25 mg by mouth daily.  . colchicine (COLCRYS) 0.6 MG tablet Take 0.6 mg by mouth daily.   No facility-administered encounter medications on file as of 02/01/2017.   :  Review of Systems:  Out of a complete 14 point review of systems, all are reviewed and negative with the exception of these symptoms as listed below: Review of Systems  Neurological:       Patient states that it took a while to adjust to CPAP but now he is doing well. No new concerns.     Objective:  Neurologic Exam  Physical Exam Physical Examination:   Vitals:   02/01/17 1419  BP: 128/72  Pulse: 78  Resp: 18   General Examination: The patient is a very pleasant 66 y.o. male in no acute distress. He appears well-developed and well-nourished and well groomed.   HEENT: Normocephalic, atraumatic, pupils are equal, round and reactive to light and accommodation. Extraocular tracking is good without limitation to gaze excursion or nystagmus noted. Normal smooth pursuit is noted. Hearing is grossly intact. Face is symmetric with normal facial animation and normal facial sensation. Speech is clear with no dysarthria noted. There is no hypophonia. There is no lip, neck/head, jaw or voice tremor. Neck is supple with full range of passive and active motion. Oropharynx exam reveals: no significant  mouth dryness, adequate dental hygiene and marked airway crowding, Mallampati is class III. Tongue protrudes centrally and palate elevates symmetrically.    Chest: Clear to auscultation without wheezing, rhonchi or crackles noted.  Heart: S1+S2+0, regular and normal without murmurs, rubs or gallops noted.   Abdomen: Soft, non-tender and non-distended with normal bowel sounds appreciated on auscultation.  Extremities: There is no pitting edema in the distal lower extremities bilaterally. Pedal pulses are intact.  Skin: Warm and dry without trophic changes noted. There are no varicose veins.  Musculoskeletal: exam reveals no obvious joint deformities, tenderness or joint swelling or erythema.   Neurologically:  Mental status: The patient is awake, alert and oriented in all 4 spheres. His immediate and remote memory, attention, language skills and fund of knowledge are appropriate. There is no evidence of aphasia, agnosia, apraxia or anomia. Speech is clear with normal prosody and enunciation. Thought process is linear. Mood is normal and affect is normal.  Cranial nerves II - XII are as described above under HEENT exam.  Motor exam: Normal bulk, strength and tone is noted. There is no drift, resting tremor. Reflexes are 1+ throughout. Fine motor skills and coordination: grossly intact.  Cerebellar testing: No dysmetria or intention tremor. There is no truncal or gait ataxia.  Sensory exam: intact to light touch.  Gait, station and balance: He stands easily. No veering to one side is noted. No leaning to one side is noted. Posture is age-appropriate and stance is narrow based. Gait shows normal stride length and normal pace. No problems turning are noted.   Assessment and Plan:   In summary, Luis Boone is a very pleasant 66 year old male with an underlying medical history of hypertension, hyperlipidemia, hiatal hernia, peptic ulcer disease, depression, gout, mitral valve repair,  arthritis and status post arthroscopic knee surgeries, as well as obesity, who presents for follow-up consultation of his severe obstructive sleep apnea, as determined with a split-night sleep study on 11/14/2016, overall baseline AHI was 58.8 per hour, O2 nadir was 85%. He did rather well with CPAP of 15 cm. We reviewed his sleep  study results as well as his compliance data. He is fully compliant with treatment and endorses improvement in his sleep consolidation, sleep quality and daytime somnolence. Physical exam is stable with the exception of mild weight increase. He is working on this and motivated to lose weight. He has started to walk regularly with his wife and typically also his son who stays with him as he goes to college. He is commended for his CPAP adherence. He is advised to continue to strive for weight loss and be fully compliant with CPAP therapy. I suggested a six-month follow-up routinely, then hopefully once a year after that. I answered all his questions today and he was in agreement.  I spent 20 minutes in total face-to-face time with the patient, more than 50% of which was spent in counseling and coordination of care, reviewing test results, reviewing medication and discussing or reviewing the diagnosis of OSA, its prognosis and treatment options. Pertinent laboratory and imaging test results that were available during this visit with the patient were reviewed by me and considered in my medical decision making (see chart for details).

## 2017-02-01 NOTE — Patient Instructions (Addendum)

## 2017-02-21 DIAGNOSIS — G4733 Obstructive sleep apnea (adult) (pediatric): Secondary | ICD-10-CM | POA: Diagnosis not present

## 2017-02-24 DIAGNOSIS — G4733 Obstructive sleep apnea (adult) (pediatric): Secondary | ICD-10-CM | POA: Diagnosis not present

## 2017-02-24 DIAGNOSIS — E784 Other hyperlipidemia: Secondary | ICD-10-CM | POA: Diagnosis not present

## 2017-02-24 DIAGNOSIS — Z1389 Encounter for screening for other disorder: Secondary | ICD-10-CM | POA: Diagnosis not present

## 2017-02-24 DIAGNOSIS — Z6839 Body mass index (BMI) 39.0-39.9, adult: Secondary | ICD-10-CM | POA: Diagnosis not present

## 2017-02-24 DIAGNOSIS — Z952 Presence of prosthetic heart valve: Secondary | ICD-10-CM | POA: Diagnosis not present

## 2017-02-24 DIAGNOSIS — I10 Essential (primary) hypertension: Secondary | ICD-10-CM | POA: Diagnosis not present

## 2017-02-24 DIAGNOSIS — N183 Chronic kidney disease, stage 3 (moderate): Secondary | ICD-10-CM | POA: Diagnosis not present

## 2017-03-23 DIAGNOSIS — G4733 Obstructive sleep apnea (adult) (pediatric): Secondary | ICD-10-CM | POA: Diagnosis not present

## 2017-04-23 DIAGNOSIS — G4733 Obstructive sleep apnea (adult) (pediatric): Secondary | ICD-10-CM | POA: Diagnosis not present

## 2017-05-24 DIAGNOSIS — G4733 Obstructive sleep apnea (adult) (pediatric): Secondary | ICD-10-CM | POA: Diagnosis not present

## 2017-06-23 DIAGNOSIS — G4733 Obstructive sleep apnea (adult) (pediatric): Secondary | ICD-10-CM | POA: Diagnosis not present

## 2017-07-24 DIAGNOSIS — G4733 Obstructive sleep apnea (adult) (pediatric): Secondary | ICD-10-CM | POA: Diagnosis not present

## 2017-08-02 ENCOUNTER — Encounter: Payer: Self-pay | Admitting: Neurology

## 2017-08-04 ENCOUNTER — Encounter: Payer: Self-pay | Admitting: Neurology

## 2017-08-04 ENCOUNTER — Ambulatory Visit (INDEPENDENT_AMBULATORY_CARE_PROVIDER_SITE_OTHER): Payer: PPO | Admitting: Neurology

## 2017-08-04 VITALS — BP 118/73 | HR 66 | Ht 67.0 in | Wt 261.0 lb

## 2017-08-04 DIAGNOSIS — G4733 Obstructive sleep apnea (adult) (pediatric): Secondary | ICD-10-CM

## 2017-08-04 DIAGNOSIS — Z9989 Dependence on other enabling machines and devices: Secondary | ICD-10-CM | POA: Diagnosis not present

## 2017-08-04 NOTE — Patient Instructions (Addendum)
Keep up the good work!  If you have any further mask issues, we may be able to help too.   Please continue using your CPAP regularly. While your insurance requires that you use CPAP at least 4 hours each night on 70% of the nights, I recommend, that you not skip any nights and use it throughout the night if you can. Getting used to CPAP and staying with the treatment long term does take time and patience and discipline. Untreated obstructive sleep apnea when it is moderate to severe can have an adverse impact on cardiovascular health and raise her risk for heart disease, arrhythmias, hypertension, congestive heart failure, stroke and diabetes. Untreated obstructive sleep apnea causes sleep disruption, nonrestorative sleep, and sleep deprivation. This can have an impact on your day to day functioning and cause daytime sleepiness and impairment of cognitive function, memory loss, mood disturbance, and problems focussing. Using CPAP regularly can improve these symptoms.  We will see you back in one year! Happy holidays!

## 2017-08-04 NOTE — Progress Notes (Signed)
Subjective:    Patient ID: Luis Boone is a 66 y.o. male.  HPI     Interim history:   [name] is a 66 year old right-handed gentleman with an underlying medical history of hypertension, hyperlipidemia, hiatal hernia, peptic ulcer disease, depression, gout, mitral valve repair, arthritis and status post arthroscopic knee surgeries, as well as obesity, who presents for follow-up consultation of his obstructive sleep apnea, will established on CPAP therapy. The patient is unaccompanied today. I last saw him on 02/01/2017, at which time he reported doing well, he had adjusted well to CPAP therapy and indicated improvement of his sleep quality and daytime somnolence and did not feel he needed to take a nap on a day-to-day basis any longer. He was encouraged to continue with CPAP therapy and he was compliant at the time.   Today, 08/04/2017: I reviewed his CPAP compliance data from 07/04/2017 through 08/02/2017 which is a total of 30 days, during which time he used his CPAP every night with percent used days greater than 4 hours at 100%, indicating superb compliance with an average usage of 7 hours and 12 minutes, residual AHI at goal at 2.3 per hour, leak acceptable with the 95th percentile at 14.1 L/m on a pressure of 15 cm with EPR of 3. He reports that in the past 3 weeks, he has not slept very well, will be getting a new mask and hopes, it will help.    The patient's allergies, current medications, family history, past medical history, past social history, past surgical history and problem list were reviewed and updated as appropriate.    Previously (copied from previous notes for reference):    I first met him on 08/31/2016 at the request of his primary care physician, at which time the patient reported snoring, excessive daytime somnolence as well as morning headaches. I invited him for sleep study testing. He had a split-night sleep study on 11/14/2016. I went over his test results with him  in detail today. Baseline sleep efficiency was 73.4%, sleep latency 18.5 minutes, REM was absent. He had no slow-wave sleep. Total AHI was 58.8 per hour, average oxygen saturation was 92%, nadir was 85%. No significant PLMS were noted. He was titrated on CPAP with a pressure of 5 cm to 15 cm. AHI was 0 per hour on the final pressure, O2 nadir of 95% and supine non-REM sleep was achieved. He achieved 30.4% of slow-wave sleep and REM sleep at 7.8% during the titration portion of the study. Average oxygen saturation was 94%, nadir was 91%. He had no significant PLMS during the second part of the study, no significant EKG changes. He was fitted with a medium full face mask which he preferred over the nasal mask. Based on his test results are prescribed CPAP therapy for home use.  I reviewed his CPAP compliance data from 01/01/2017 through 01/30/2017 which is a total of 30 days, during which time he used his CPAP every night with percent used days greater than 4 hours at 100%, indicating superb compliance with an average usage of 7 hours and 5 minutes, residual AHI 4.2 per hour, leak acceptable for the 95th percentile at 8 L/m on a pressure of 15 cm with EPR of 3.   08/31/16: He reports snoring and excessive daytime somnolence as well as morning headaches. I reviewed your office note from 08/26/2016, which you kindly included. 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 He has  2 grown children (one adopted, one biological). He does not smoke or drink alcohol. He is going to retire by the end of this year. Bedtime is about 10:00 to 10:30 PM, and likes to read on his [place], is asleep latest by 11 PM.  His Rise time is 5:15 AM. He does not always wake up rested. He has had fairly frequent morning headaches in the past few months, snoring has become worse in the past 12-18 months and he has also been noted to sleep talking the past 12 months per wife's report. His Epworth sleepiness  score is 15 out of 24 today, his fatigue score is 41 out of 63. His sister may have obstructive sleep apnea and uses a CPAP machine as far as he can recall. He would be willing to try CPAP therapy. He denies restless legs symptoms and is not sure if he twitches or kicks his legs in his sleep. He has nocturia about 2-3 times per average night. Weight has been fluctuating. He is working on weight loss. His dentist made him aware that his airway is crowded and small and that he may need to be tested for sleep apnea. He had recent blood work in your office on 08/19/2016 and I reviewed the results. PSA was 0.502, lipid profile was good with total cholesterol at 114, LDL 55, HDL 44, liver function test normal, BUN and creatinine normal.  He is a nonsmoker, does not drink alcohol, drinks caffeine in limitation, usually one per day in the morning in the form of coffee.  His Past Medical History Is Significant For: Past Medical History:  Diagnosis Date  . Allergy    seasonal  . Arthritis   . Depression   . GERD (gastroesophageal reflux disease)   . Gout   . Gout   . Heart murmur    heart valve repair  . Hiatal hernia   . HLD (hyperlipidemia)   . Hypertension   . Mitral murmur   . Obesity   . Ocular herpes simplex   . PUD (peptic ulcer disease)     His Past Surgical History Is Significant For: Past Surgical History:  Procedure Laterality Date  . COLONOSCOPY    . KNEE ARTHROSCOPY Right 2006  . MITRAL VALVE REPAIR  2004    His Family History Is Significant For: Family History  Problem Relation Age of Onset  . Migraines Mother   . Hypertension Mother   . Dementia Mother   . Cancer Father   . Arthritis Father   . Heart attack Father   . Colon cancer Neg Hx     His Social History Is Significant For: Social History   Socioeconomic History  . Marital status: Unknown    Spouse name: None  . Number of children: None  . Years of education: None  . Highest education level: None   Social Needs  . Financial resource strain: None  . Food insecurity - worry: None  . Food insecurity - inability: None  . Transportation needs - medical: None  . Transportation needs - non-medical: None  Occupational History  . None  Tobacco Use  . Smoking status: Never Smoker  . Smokeless tobacco: Never Used  Substance and Sexual Activity  . Alcohol use: No    Alcohol/week: 0.0 oz  . Drug use: No  . Sexual activity: None  Other Topics Concern  . None  Social History Narrative  . None    His Allergies Are:  Allergies  Allergen Reactions  .  Prilosec [Omeprazole] Rash  . Vicodin [Hydrocodone-Acetaminophen] Rash  . Other     Medication to sedate him for a colonoscopy.  :   His Current Medications Are:  Outpatient Encounter Medications as of 08/04/2017  Medication Sig  . allopurinol (ZYLOPRIM) 100 MG tablet Take 100 mg by mouth daily.  Marland Kitchen aspirin 81 MG tablet Take 81 mg by mouth daily.  Marland Kitchen atorvastatin (LIPITOR) 40 MG tablet Take 40 mg by mouth daily.  . colchicine (COLCRYS) 0.6 MG tablet Take 0.6 mg by mouth daily.  Marland Kitchen loratadine (CLARITIN) 10 MG tablet Take 10 mg by mouth daily.  . metoprolol succinate (TOPROL-XL) 25 MG 24 hr tablet Take 25 mg by mouth daily.  . [DISCONTINUED] clindamycin (CLEOCIN) 300 MG capsule    No facility-administered encounter medications on file as of 08/04/2017.   :  Review of Systems:  Out of a complete 14 point review of systems, all are reviewed and negative with the exception of these symptoms as listed below:  Review of Systems  Neurological:       Patient states that he has not been sleeping well for the last 3 weeks. He is getting a new mask today and hopes that will help.     Objective:  Neurological Exam  Physical Exam Physical Examination:   Vitals:   08/04/17 1011  BP: 118/73  Pulse: 66   General Examination: The patient is a very pleasant 66 y.o. male in no acute distress. He appears well-developed and well-nourished  and well groomed.   HEENT:Normocephalic, atraumatic, pupils are equal, round and reactive to light and accommodation. Extraocular tracking is good without limitation to gaze excursion or nystagmus noted. Normal smooth pursuit is noted. Hearing is grossly intact. Face is symmetric with normal facial animation and normal facial sensation. Speech is clear with no dysarthria noted. There is no hypophonia. There is no lip, neck/head, jaw or voice tremor. Neck is supple with full range of passive and active motion. Oropharynx exam reveals: no significant mouth dryness, adequatedental hygiene and markedairway crowding, Mallampati is class III. Tongue protrudes centrally and palate elevates symmetrically.    Chest:Clear to auscultation without wheezing, rhonchi or crackles noted.  Heart:S1+S2+0, regular and normal without murmurs, rubs or gallops noted.   Abdomen:Soft, non-tender and non-distended with normal bowel sounds appreciated on auscultation.  Extremities:There is nopitting edema in the distal lower extremities bilaterally. Pedal pulses are intact.  Skin: Warm and dry without trophic changes noted. There are no varicose veins.  Musculoskeletal: exam reveals no obvious joint deformities, tenderness or joint swelling or erythema.   Neurologically:  Mental status: The patient is awake, alert and oriented in all 4 spheres. Hisimmediate and remote memory, attention, language skills and fund of knowledge are appropriate. There is no evidence of aphasia, agnosia, apraxia or anomia. Speech is clear with normal prosody and enunciation. Thought process is linear. Mood is normaland affect is normal.  Cranial nerves II - XII are as described above under HEENT exam.  Motor exam: Normal bulk, strength and tone is noted. There is no drift, resting tremor. Reflexes are 1+ throughout. Fine motor skills and coordination: grossly intact.  Cerebellar testing: No dysmetria or intention tremor. There  is no truncal or gait ataxia.  Sensory exam: intact to light touch.  Gait, station and balance: Hestands easily. No veering to one side is noted. No leaning to one side is noted. Posture is age-appropriate and stance is narrow based. Gait shows normalstride length and normalpace. No problems turning are  noted.   Assessment and Plan:   In summary, Nikan Ellingson Brownis a very pleasant 67 year old malewith an underlying medical history of hypertension, hyperlipidemia, hiatal hernia, peptic ulcer disease, depression, gout, mitral valve repair, arthritis and status post arthroscopic knee surgeries, as well as obesity, who presents for follow-up consultation of his severe obstructive sleep apnea, as determined with a split-night sleep study on 11/14/2016 (wit baseline AHI was 58.8 per hour, O2 nadir of 85%). He did rather well with CPAP of 15 cm and continues to be compliant fully, for which he is commended. We reviewed his sleep study results briefly again today as well as his most recent compliance data. He has intermittent issues with arthritis pain affecting his knees and hips but takes Tylenol as needed. Physical exam is stable. He is working on his weight loss and is going to join [employer]. He likes to walk outside but weather is not permissive of this at this time. He has had difficult time recently with his mask fit and some issues with waking up in the middle of the night. He has an appointment today with his DME company for a mask refit and may be able to try a new kind of my desk. He is advised to also call us if he needs additional help after that, we could help with mask fit issues as well. From my end of things he is doing rather well and is advised to follow-up in one year, sooner as needed. I answered all his questions today and he was in agreement.  I spent 20 minutes in total face-to-face time with the patient, more than 50% of which was spent in counseling and coordination of care,  reviewing test results, reviewing medication and discussing or reviewing the diagnosis of OSA, its prognosis and treatment options. Pertinent laboratory and imaging test results that were available during this visit with the patient were reviewed by me and considered in my medical decision making (see chart for details).

## 2017-08-23 DIAGNOSIS — G4733 Obstructive sleep apnea (adult) (pediatric): Secondary | ICD-10-CM | POA: Diagnosis not present

## 2017-09-15 DIAGNOSIS — R82998 Other abnormal findings in urine: Secondary | ICD-10-CM | POA: Diagnosis not present

## 2017-09-15 DIAGNOSIS — I10 Essential (primary) hypertension: Secondary | ICD-10-CM | POA: Diagnosis not present

## 2017-09-15 DIAGNOSIS — Z125 Encounter for screening for malignant neoplasm of prostate: Secondary | ICD-10-CM | POA: Diagnosis not present

## 2017-09-15 DIAGNOSIS — E7849 Other hyperlipidemia: Secondary | ICD-10-CM | POA: Diagnosis not present

## 2017-09-15 DIAGNOSIS — M109 Gout, unspecified: Secondary | ICD-10-CM | POA: Diagnosis not present

## 2017-09-22 DIAGNOSIS — M1990 Unspecified osteoarthritis, unspecified site: Secondary | ICD-10-CM | POA: Diagnosis not present

## 2017-09-22 DIAGNOSIS — Z0000 Encounter for general adult medical examination without abnormal findings: Secondary | ICD-10-CM | POA: Diagnosis not present

## 2017-09-22 DIAGNOSIS — Z952 Presence of prosthetic heart valve: Secondary | ICD-10-CM | POA: Diagnosis not present

## 2017-09-22 DIAGNOSIS — E7849 Other hyperlipidemia: Secondary | ICD-10-CM | POA: Diagnosis not present

## 2017-09-22 DIAGNOSIS — I10 Essential (primary) hypertension: Secondary | ICD-10-CM | POA: Diagnosis not present

## 2017-09-22 DIAGNOSIS — G4733 Obstructive sleep apnea (adult) (pediatric): Secondary | ICD-10-CM | POA: Diagnosis not present

## 2017-09-22 DIAGNOSIS — H9313 Tinnitus, bilateral: Secondary | ICD-10-CM | POA: Diagnosis not present

## 2017-09-22 DIAGNOSIS — M109 Gout, unspecified: Secondary | ICD-10-CM | POA: Diagnosis not present

## 2017-09-22 DIAGNOSIS — J3089 Other allergic rhinitis: Secondary | ICD-10-CM | POA: Diagnosis not present

## 2017-09-22 DIAGNOSIS — N183 Chronic kidney disease, stage 3 (moderate): Secondary | ICD-10-CM | POA: Diagnosis not present

## 2017-09-22 DIAGNOSIS — Z6839 Body mass index (BMI) 39.0-39.9, adult: Secondary | ICD-10-CM | POA: Diagnosis not present

## 2017-09-22 DIAGNOSIS — Z23 Encounter for immunization: Secondary | ICD-10-CM | POA: Diagnosis not present

## 2017-09-23 DIAGNOSIS — G4733 Obstructive sleep apnea (adult) (pediatric): Secondary | ICD-10-CM | POA: Diagnosis not present

## 2017-09-30 DIAGNOSIS — Z1212 Encounter for screening for malignant neoplasm of rectum: Secondary | ICD-10-CM | POA: Diagnosis not present

## 2017-10-03 DIAGNOSIS — I129 Hypertensive chronic kidney disease with stage 1 through stage 4 chronic kidney disease, or unspecified chronic kidney disease: Secondary | ICD-10-CM | POA: Diagnosis not present

## 2017-10-03 DIAGNOSIS — N183 Chronic kidney disease, stage 3 (moderate): Secondary | ICD-10-CM | POA: Diagnosis not present

## 2017-10-03 DIAGNOSIS — Z9889 Other specified postprocedural states: Secondary | ICD-10-CM | POA: Diagnosis not present

## 2017-10-03 DIAGNOSIS — Z09 Encounter for follow-up examination after completed treatment for conditions other than malignant neoplasm: Secondary | ICD-10-CM | POA: Diagnosis not present

## 2017-10-03 DIAGNOSIS — I10 Essential (primary) hypertension: Secondary | ICD-10-CM | POA: Diagnosis not present

## 2017-10-24 DIAGNOSIS — G4733 Obstructive sleep apnea (adult) (pediatric): Secondary | ICD-10-CM | POA: Diagnosis not present

## 2017-11-15 DIAGNOSIS — H10813 Pingueculitis, bilateral: Secondary | ICD-10-CM | POA: Diagnosis not present

## 2017-11-16 ENCOUNTER — Encounter: Payer: Self-pay | Admitting: Internal Medicine

## 2017-11-21 DIAGNOSIS — G4733 Obstructive sleep apnea (adult) (pediatric): Secondary | ICD-10-CM | POA: Diagnosis not present

## 2017-12-22 DIAGNOSIS — G4733 Obstructive sleep apnea (adult) (pediatric): Secondary | ICD-10-CM | POA: Diagnosis not present

## 2018-01-01 ENCOUNTER — Emergency Department (HOSPITAL_COMMUNITY)
Admission: EM | Admit: 2018-01-01 | Discharge: 2018-01-01 | Disposition: A | Discharge status: Home / Self Care | Payer: PPO | Attending: Emergency Medicine | Admitting: Emergency Medicine

## 2018-01-01 ENCOUNTER — Emergency Department (HOSPITAL_COMMUNITY): Payer: PPO

## 2018-01-01 ENCOUNTER — Encounter (HOSPITAL_COMMUNITY): Payer: Self-pay | Admitting: Emergency Medicine

## 2018-01-01 DIAGNOSIS — I10 Essential (primary) hypertension: Secondary | ICD-10-CM | POA: Insufficient documentation

## 2018-01-01 DIAGNOSIS — M542 Cervicalgia: Secondary | ICD-10-CM

## 2018-01-01 DIAGNOSIS — Y9222 Religious institution as the place of occurrence of the external cause: Secondary | ICD-10-CM | POA: Insufficient documentation

## 2018-01-01 DIAGNOSIS — S0990XA Unspecified injury of head, initial encounter: Secondary | ICD-10-CM

## 2018-01-01 DIAGNOSIS — W108XXA Fall (on) (from) other stairs and steps, initial encounter: Secondary | ICD-10-CM | POA: Insufficient documentation

## 2018-01-01 DIAGNOSIS — S0180XA Unspecified open wound of other part of head, initial encounter: Secondary | ICD-10-CM | POA: Diagnosis not present

## 2018-01-01 DIAGNOSIS — S6992XA Unspecified injury of left wrist, hand and finger(s), initial encounter: Secondary | ICD-10-CM | POA: Diagnosis not present

## 2018-01-01 DIAGNOSIS — S299XXA Unspecified injury of thorax, initial encounter: Secondary | ICD-10-CM | POA: Diagnosis not present

## 2018-01-01 DIAGNOSIS — S0101XA Laceration without foreign body of scalp, initial encounter: Secondary | ICD-10-CM | POA: Diagnosis not present

## 2018-01-01 DIAGNOSIS — Y9389 Activity, other specified: Secondary | ICD-10-CM | POA: Diagnosis not present

## 2018-01-01 DIAGNOSIS — S199XXA Unspecified injury of neck, initial encounter: Secondary | ICD-10-CM | POA: Diagnosis not present

## 2018-01-01 DIAGNOSIS — Y999 Unspecified external cause status: Secondary | ICD-10-CM | POA: Insufficient documentation

## 2018-01-01 DIAGNOSIS — W19XXXA Unspecified fall, initial encounter: Secondary | ICD-10-CM

## 2018-01-01 DIAGNOSIS — Z79899 Other long term (current) drug therapy: Secondary | ICD-10-CM | POA: Insufficient documentation

## 2018-01-01 DIAGNOSIS — M546 Pain in thoracic spine: Secondary | ICD-10-CM | POA: Diagnosis not present

## 2018-01-01 DIAGNOSIS — M25532 Pain in left wrist: Secondary | ICD-10-CM | POA: Diagnosis not present

## 2018-01-01 LAB — CBC
HEMATOCRIT: 41.2 % (ref 39.0–52.0)
Hemoglobin: 13.5 g/dL (ref 13.0–17.0)
MCH: 30.5 pg (ref 26.0–34.0)
MCHC: 32.8 g/dL (ref 30.0–36.0)
MCV: 93 fL (ref 78.0–100.0)
Platelets: 163 10*3/uL (ref 150–400)
RBC: 4.43 MIL/uL (ref 4.22–5.81)
RDW: 14.2 % (ref 11.5–15.5)
WBC: 7 10*3/uL (ref 4.0–10.5)

## 2018-01-01 LAB — BASIC METABOLIC PANEL
Anion gap: 8 (ref 5–15)
BUN: 16 mg/dL (ref 6–20)
CHLORIDE: 111 mmol/L (ref 101–111)
CO2: 22 mmol/L (ref 22–32)
Calcium: 9 mg/dL (ref 8.9–10.3)
Creatinine, Ser: 1.49 mg/dL — ABNORMAL HIGH (ref 0.61–1.24)
GFR calc Af Amer: 54 mL/min — ABNORMAL LOW (ref >60)
GFR calc non Af Amer: 47 mL/min — ABNORMAL LOW (ref >60)
GLUCOSE: 114 mg/dL — AB (ref 65–99)
POTASSIUM: 4.1 mmol/L (ref 3.5–5.1)
Sodium: 141 mmol/L (ref 135–145)

## 2018-01-01 MED ORDER — IBUPROFEN 400 MG PO TABS
400.0000 mg | ORAL_TABLET | Freq: Once | ORAL | Status: DC
Start: 2018-01-01 — End: 2018-01-01
  Filled 2018-01-01: qty 1

## 2018-01-01 MED ORDER — OXYCODONE-ACETAMINOPHEN 5-325 MG PO TABS: 1.0000 | ORAL_TABLET | ORAL | 0 refills | Status: DC | PRN

## 2018-01-01 MED ORDER — MORPHINE SULFATE (PF) 4 MG/ML IV SOLN
4.0000 mg | Freq: Once | INTRAVENOUS | Status: AC
  Administered 2018-01-01: 4 mg via INTRAVENOUS
  Filled 2018-01-01: qty 1

## 2018-01-01 MED ORDER — MORPHINE SULFATE (PF) 4 MG/ML IV SOLN
4.0000 mg | Freq: Once | INTRAVENOUS | Status: AC
Start: 2018-01-01 — End: 2018-01-01
  Administered 2018-01-01: 4 mg via INTRAVENOUS
  Filled 2018-01-01: qty 1

## 2018-01-01 MED ORDER — METHOCARBAMOL 500 MG PO TABS: 500.0000 mg | ORAL_TABLET | Freq: Every evening | ORAL | 0 refills | Status: DC | PRN

## 2018-01-01 MED ORDER — OXYCODONE-ACETAMINOPHEN 5-325 MG PO TABS: 1.0000 | ORAL_TABLET | Freq: Once | ORAL | Status: DC

## 2018-01-01 NOTE — ED Triage Notes (Signed)
Per EMS:  Patient presents to ED for assessment after falling [place] the steps at [facility].  Patient fells approx 5-6 steps on to his side and back.  Patient now c/o neck pain, upper back pain.  Patient has a laceration to the back of his head, bleeding is controlled.  Bystanders report LOC x 1-2 minutes.  Patient c/o discomfort below the c-collar, but no chest pain, no SOB

## 2018-01-01 NOTE — ED Notes (Signed)
Patient transported to X-ray 

## 2018-01-01 NOTE — ED Provider Notes (Addendum)
[facility] Provider Note   CSN: [id number] Arrival date & time: 01/01/18  1139     History   Chief Complaint Chief Complaint  Patient presents with  . Fall    HPI Luis Boone is a 67 y.o. male who presents with a fall.  Past medical history significant for allergies, arthritis, GERD, gout, mitral valve repair, hypertension, obesity.  The patient was at church and was going downstairs and fell backwards and down the stairs.  The patient cannot recall why he fell.  He lost consciousness and when he regained consciousness he was on the ground with people around him.  EMS was called who got him onto a stretcher.  They applied c-collar because he has been having severe upper back pain, neck pain, headache and collarbone pain.  He also has some mild left wrist pain.  He has a laceration on the back of his head.  He states he is on a baby aspirin but does not take anticoagulants.  He denies shortness of breath, abdominal pain, upper extremity pain, lower extremity pain.  He has not been able to ambulate because he feels really lightheaded when he sits up.  HPI  Past Medical History:  Diagnosis Date  . Allergy    seasonal  . Arthritis   . Depression   . GERD (gastroesophageal reflux disease)   . Gout   . Gout   . Heart murmur    heart valve repair  . Hiatal hernia   . HLD (hyperlipidemia)   . Hypertension   . Mitral murmur   . Obesity   . Ocular herpes simplex   . PUD (peptic ulcer disease)     There are no active problems to display for this patient.   Past Surgical History:  Procedure Laterality Date  . COLONOSCOPY    . KNEE ARTHROSCOPY Right 2006  . MITRAL VALVE REPAIR  2004        Home Medications    Prior to Admission medications   Medication Sig Start Date End Date Taking? Authorizing Provider  allopurinol (ZYLOPRIM) 100 MG tablet Take 100 mg by mouth daily.    [provider]  aspirin 81 MG tablet Take 81 mg  by mouth daily.    [provider]  atorvastatin (LIPITOR) 40 MG tablet Take 40 mg by mouth daily.    [provider]  colchicine (COLCRYS) 0.6 MG tablet Take 0.6 mg by mouth daily.    [provider]  loratadine (CLARITIN) 10 MG tablet Take 10 mg by mouth daily.    [provider]  metoprolol succinate (TOPROL-XL) 25 MG 24 hr tablet Take 25 mg by mouth daily.    [provider]    Family History Family History  Problem Relation Age of Onset  . Migraines Mother   . Hypertension Mother   . Dementia Mother   . Cancer Father   . Arthritis Father   . Heart attack Father   . Colon cancer Neg Hx     Social History Social History   Tobacco Use  . Smoking status: Never Smoker  . Smokeless tobacco: Never Used  Substance Use Topics  . Alcohol use: No    Alcohol/week: 0.0 oz  . Drug use: No     Allergies   Prilosec [omeprazole]; Vicodin [hydrocodone-acetaminophen]; and Other   Review of Systems Review of Systems  Eyes: Negative for visual disturbance.  Respiratory: Negative for shortness of breath.   Cardiovascular:  Positive for chest pain.  Gastrointestinal: Negative for abdominal pain.  Musculoskeletal: Positive for arthralgias, back pain, myalgias and neck pain. Negative for gait problem and joint swelling.  Skin: Positive for wound.  Neurological: Positive for dizziness, syncope, light-headedness and headaches. Negative for weakness.  Hematological: Bruises/bleeds easily (On aspirin).  All other systems reviewed and are negative.    Physical Exam Updated Vital Signs BP 131/89   Pulse 73   Temp 97.8 F (36.6 C) (Oral)   Resp (!) 22   Ht 5\' 7"  (1.702 m)   Wt 115.7 kg (255 lb)   SpO2 98%   BMI 39.94 kg/m   Physical Exam  Constitutional: He is oriented to person, place, and time. He appears well-developed and well-nourished. He appears distressed (Mildly distressed due to pain.).  HENT:  Head: Normocephalic.    Posterior scalp laceration  Eyes: Pupils are equal, round, and reactive to light. Conjunctivae are normal. Right eye exhibits no discharge. Left eye exhibits no discharge. No scleral icterus.  Neck: Normal range of motion.  Cardiovascular: Normal rate and regular rhythm.  Pulmonary/Chest: Effort normal and breath sounds normal. No respiratory distress. He exhibits tenderness (significant tenderness over the bilateral clavicles and upper sternum).  Abdominal: Soft. Bowel sounds are normal. He exhibits no distension. There is no tenderness.  Musculoskeletal:  Upper extremities: Bilateral shoulders, elbow, wrist, fingers have full range of motion.  Reported mild tenderness over the left lateral wrist.  2+ radial pulses bilaterally.  Lower extremities: Bilateral hips, knees, ankles have full range of motion and are nontender.  Back: Mild midline tenderness over upper thoracic area. No lumbar tenderness  Neurological: He is alert and oriented to person, place, and time.  Skin: Skin is warm and dry.  Psychiatric: He has a normal mood and affect. His behavior is normal.  Nursing note and vitals reviewed.    ED Treatments / Results  Labs (all labs ordered are listed, but only abnormal results are displayed) Labs Reviewed  BASIC METABOLIC PANEL - Abnormal; Notable for the following components:      Result Value   Glucose, Bld 114 (*)    Creatinine, Ser 1.49 (*)    GFR calc non Af Amer 47 (*)    GFR calc Af Amer 54 (*)    All other components within normal limits  CBC    EKG None  Radiology Dg Chest 2 View  Result Date: 01/01/2018 CLINICAL DATA:  Fall, mid upper back pain EXAM: CHEST - 2 VIEW COMPARISON:  None. FINDINGS: Heart size and mediastinal contours are within normal limits. Lungs are clear. No pleural effusion or pneumothorax seen. No acute or suspicious osseous finding. IMPRESSION: No acute findings. Electronically Signed   By: Franki Cabot M.D.   On: 01/01/2018 14:07   Dg  Thoracic Spine 2 View  Result Date: 01/01/2018 CLINICAL DATA:  Fall onto back, mid upper back pain. EXAM: THORACIC SPINE 2 VIEWS COMPARISON:  None FINDINGS: Mild levoscoliosis of the thoracic spine. No evidence of acute vertebral body subluxation. No fracture line or displaced fracture fragment seen. Mild degenerative spondylitic within the upper and midthoracic spine, with associated mild spurring. No evidence of advanced degenerative change. Visualized paravertebral soft tissues are unremarkable. IMPRESSION: 1. No acute findings.  No fracture or acute subluxation seen. 2. Mild degenerative change. 3. Mild scoliosis. Electronically Signed   By: Franki Cabot M.D.   On: 01/01/2018 14:06   Dg Wrist Complete Left  Result Date: 01/01/2018 CLINICAL DATA:  Pain following  fall EXAM: LEFT WRIST - COMPLETE 3+ VIEW COMPARISON:  None. FINDINGS: Frontal, oblique, lateral, and ulnar deviation scaphoid images were obtained. There is no fracture or dislocation. There is osteoarthritic change in the first carpal-metacarpal joint. Other joint spaces appear normal. No erosive change. IMPRESSION: Osteoarthritic change in first carpal-metacarpal joint. Other joint spaces appear unremarkable. No fracture or dislocation. Electronically Signed   By: Lowella Grip III M.D.   On: 01/01/2018 14:04   Ct Head Wo Contrast  Result Date: 01/01/2018 CLINICAL DATA:  Pain following trauma EXAM: CT HEAD WITHOUT CONTRAST CT CERVICAL SPINE WITHOUT CONTRAST TECHNIQUE: Multidetector CT imaging of the head and cervical spine was performed following the standard protocol without intravenous contrast. Multiplanar CT image reconstructions of the cervical spine were also generated. COMPARISON:  None. FINDINGS: CT HEAD FINDINGS Brain: There is mild diffuse atrophy. There is no intracranial mass, hemorrhage, extra-axial fluid collection, or midline shift. There is mild patchy small vessel disease in the centra semiovale bilaterally. Elsewhere  gray-white compartments appear normal. No evident acute infarct. Vascular: No hyperdense vessel. There are foci of calcification in each carotid siphon region. Skull: Bony calvarium appears intact. There is a right parietal and occipital scalp hematoma. Sinuses/Orbits: There is a small air-fluid level in the right maxillary antrum. There is mucosal thickening in multiple ethmoid air cells. There is also mild mucosal thickening in the inferior left frontal sinus. Orbits appear symmetric bilaterally. Other: Mastoid air cells are clear. CT CERVICAL SPINE FINDINGS Alignment: There is no appreciable spondylolisthesis. Skull base and vertebrae: Skull base and craniocervical junction regions appear normal. No fracture. No blastic or lytic bone lesions. Soft tissues and spinal canal: Prevertebral soft tissues and predental space regions are normal. No paraspinous lesion. No evident cord or canal hematoma. Disc levels: There is moderately severe disc space narrowing at C5-6, C6-7, and C7-T1. There are anterior osteophytes at C4, C5, C6, and C7. There is calcification in the anterior ligament at C4-5 and C5-6. There is facet hypertrophy at several levels bilaterally. There is exit foraminal narrowing with impression on exiting nerve roots at C5-6 bilaterally. No disc extrusion or high-grade stenosis. Upper chest: Visualized upper lung zones are clear. Other: There are foci of calcification in each carotid artery. IMPRESSION: CT head: Mild atrophy with mild patchy periventricular small vessel disease. No acute infarct. No mass or hemorrhage. Foci of arterial vascular calcification noted. Small parietal and occipital scalp hematoma on the right. No fracture. There are areas of paranasal sinus disease. CT cervical spine: No fracture or spondylolisthesis. Areas of osteoarthritic change, most notably at C5-6. There are foci of carotid artery calcification bilaterally. Electronically Signed   By: Lowella Grip III M.D.   On:  01/01/2018 14:37   Ct Cervical Spine Wo Contrast  Result Date: 01/01/2018 CLINICAL DATA:  Pain following trauma EXAM: CT HEAD WITHOUT CONTRAST CT CERVICAL SPINE WITHOUT CONTRAST TECHNIQUE: Multidetector CT imaging of the head and cervical spine was performed following the standard protocol without intravenous contrast. Multiplanar CT image reconstructions of the cervical spine were also generated. COMPARISON:  None. FINDINGS: CT HEAD FINDINGS Brain: There is mild diffuse atrophy. There is no intracranial mass, hemorrhage, extra-axial fluid collection, or midline shift. There is mild patchy small vessel disease in the centra semiovale bilaterally. Elsewhere gray-white compartments appear normal. No evident acute infarct. Vascular: No hyperdense vessel. There are foci of calcification in each carotid siphon region. Skull: Bony calvarium appears intact. There is a right parietal and occipital scalp hematoma. Sinuses/Orbits: There is  a small air-fluid level in the right maxillary antrum. There is mucosal thickening in multiple ethmoid air cells. There is also mild mucosal thickening in the inferior left frontal sinus. Orbits appear symmetric bilaterally. Other: Mastoid air cells are clear. CT CERVICAL SPINE FINDINGS Alignment: There is no appreciable spondylolisthesis. Skull base and vertebrae: Skull base and craniocervical junction regions appear normal. No fracture. No blastic or lytic bone lesions. Soft tissues and spinal canal: Prevertebral soft tissues and predental space regions are normal. No paraspinous lesion. No evident cord or canal hematoma. Disc levels: There is moderately severe disc space narrowing at C5-6, C6-7, and C7-T1. There are anterior osteophytes at C4, C5, C6, and C7. There is calcification in the anterior ligament at C4-5 and C5-6. There is facet hypertrophy at several levels bilaterally. There is exit foraminal narrowing with impression on exiting nerve roots at C5-6 bilaterally. No disc  extrusion or high-grade stenosis. Upper chest: Visualized upper lung zones are clear. Other: There are foci of calcification in each carotid artery. IMPRESSION: CT head: Mild atrophy with mild patchy periventricular small vessel disease. No acute infarct. No mass or hemorrhage. Foci of arterial vascular calcification noted. Small parietal and occipital scalp hematoma on the right. No fracture. There are areas of paranasal sinus disease. CT cervical spine: No fracture or spondylolisthesis. Areas of osteoarthritic change, most notably at C5-6. There are foci of carotid artery calcification bilaterally. Electronically Signed   By: Lowella Grip III M.D.   On: 01/01/2018 14:37    Procedures .Marland KitchenLaceration Repair Date/Time: 01/01/2018 6:07 PM Performed by: Recardo Evangelist, PA-C Authorized by: Recardo Evangelist, PA-C   Consent:    Consent obtained:  Verbal   Consent given by:  Patient   Risks discussed:  Infection and pain   Alternatives discussed:  No treatment Anesthesia (see MAR for exact dosages):    Anesthesia method:  None Laceration details:    Location:  Scalp   Scalp location:  Occipital   Length (cm):  4   Depth (mm):  1 Repair type:    Repair type:  Simple Pre-procedure details:    Preparation:  Patient was prepped and draped in usual sterile fashion Exploration:    Hemostasis achieved with:  Direct pressure   Wound exploration: wound explored through full range of motion and entire depth of wound probed and visualized     Contaminated: no   Treatment:    Area cleansed with:  Saline   Amount of cleaning:  Standard   Visualized foreign bodies/material removed: no   Skin repair:    Repair method:  Staples   Number of staples:  2 Approximation:    Approximation:  Close Post-procedure details:    Dressing:  Open (no dressing)   Patient tolerance of procedure:  Tolerated well, no immediate complications   (including critical care time)    Medications Ordered in  ED Medications  morphine 4 MG/ML injection 4 mg (4 mg Intravenous Given 01/01/18 1331)  morphine 4 MG/ML injection 4 mg (4 mg Intravenous Given 01/01/18 1544)     Initial Impression / Assessment and Plan / ED Course  I have reviewed the triage vital signs and the nursing notes.  Pertinent labs & imaging results that were available during my care of the patient were reviewed by me and considered in my medical decision making (see chart for details).  67 year old male presents with a fall and head and neck injury.  He is mildly hypertensive on arrival however this is resolved  on repeat blood pressure checks.  All other vital signs are normal.  Initially does appear in distress due to pain.  After pain control he is much more comfortable.  His neurologic exam is unremarkable.  Will obtain CT of the head, C-spine and x-rays of the back, chest, left wrist.  Imaging is remarkable for degenerative changes without acute bony abnormality. Blood work is remarkable for elevated serum creatinine.  Patient has known CKD. 2 staples were placed for his scalp laceration.  He tolerated p.o. and ambulated.  He was discharged with pain medicine and advised to follow-up with his primary doctor.  Discussed with Dr. Eulis Foster who is in agreement with plan  Final Clinical Impressions(s) / ED Diagnoses   Final diagnoses:  Fall, initial encounter  Injury of head, initial encounter  Neck pain    ED Discharge Orders    None       Recardo Evangelist, PA-C 01/01/18 1807    Recardo Evangelist, PA-C 01/01/18 Ria Bush, MD 01/02/18 1246

## 2018-01-01 NOTE — Discharge Instructions (Signed)
Please take pain medicine for severe pain Take muscle relaxer as needed for spasms. These medicines can make you sleepy and dizzy Have staples removed in 5-7 days Follow up with your doctor Return if worsening

## 2018-01-01 NOTE — ED Notes (Signed)
Pt ambulated efficiently

## 2018-01-01 NOTE — ED Notes (Signed)
Patient now states he can't take ibuprofen due to his kidneys.

## 2018-01-01 NOTE — ED Notes (Signed)
Patient states he has had hallucinations with other narcotics in the past, will administer ibuprofen until able to be monitored

## 2018-01-01 NOTE — ED Notes (Signed)
Pt c.o severe dizziness after sitting up on the side of the bed, BP remained wnl, pt given water to sip on, this RN to hold off on giving morphine until dizziness gets better. EDP made aware

## 2018-01-09 DIAGNOSIS — Z6838 Body mass index (BMI) 38.0-38.9, adult: Secondary | ICD-10-CM | POA: Diagnosis not present

## 2018-01-09 DIAGNOSIS — G44309 Post-traumatic headache, unspecified, not intractable: Secondary | ICD-10-CM | POA: Diagnosis not present

## 2018-01-09 DIAGNOSIS — I10 Essential (primary) hypertension: Secondary | ICD-10-CM | POA: Diagnosis not present

## 2018-01-09 DIAGNOSIS — Z4802 Encounter for removal of sutures: Secondary | ICD-10-CM | POA: Diagnosis not present

## 2018-01-09 DIAGNOSIS — S060X9S Concussion with loss of consciousness of unspecified duration, sequela: Secondary | ICD-10-CM | POA: Diagnosis not present

## 2018-01-10 ENCOUNTER — Other Ambulatory Visit: Payer: Self-pay | Admitting: Family Medicine

## 2018-01-10 DIAGNOSIS — G44309 Post-traumatic headache, unspecified, not intractable: Secondary | ICD-10-CM

## 2018-01-10 DIAGNOSIS — S060X9S Concussion with loss of consciousness of unspecified duration, sequela: Secondary | ICD-10-CM

## 2018-01-12 DIAGNOSIS — G4733 Obstructive sleep apnea (adult) (pediatric): Secondary | ICD-10-CM | POA: Diagnosis not present

## 2018-01-14 ENCOUNTER — Ambulatory Visit
Admission: RE | Admit: 2018-01-14 | Discharge: 2018-01-14 | Disposition: A | Discharge status: Home / Self Care | Payer: PPO | Source: Ambulatory Visit | Attending: Family Medicine | Admitting: Family Medicine

## 2018-01-14 DIAGNOSIS — G44309 Post-traumatic headache, unspecified, not intractable: Secondary | ICD-10-CM

## 2018-01-14 DIAGNOSIS — S060X9S Concussion with loss of consciousness of unspecified duration, sequela: Secondary | ICD-10-CM

## 2018-01-17 DIAGNOSIS — S060X9S Concussion with loss of consciousness of unspecified duration, sequela: Secondary | ICD-10-CM | POA: Diagnosis not present

## 2018-01-17 DIAGNOSIS — G44309 Post-traumatic headache, unspecified, not intractable: Secondary | ICD-10-CM | POA: Diagnosis not present

## 2018-01-17 DIAGNOSIS — I10 Essential (primary) hypertension: Secondary | ICD-10-CM | POA: Diagnosis not present

## 2018-01-17 DIAGNOSIS — Z6838 Body mass index (BMI) 38.0-38.9, adult: Secondary | ICD-10-CM | POA: Diagnosis not present

## 2018-02-20 ENCOUNTER — Ambulatory Visit (INDEPENDENT_AMBULATORY_CARE_PROVIDER_SITE_OTHER): Payer: PPO | Admitting: Neurology

## 2018-02-20 ENCOUNTER — Encounter: Payer: Self-pay | Admitting: Neurology

## 2018-02-20 VITALS — BP 141/83 | HR 70 | Ht 67.5 in | Wt 256.0 lb

## 2018-02-20 DIAGNOSIS — Z9989 Dependence on other enabling machines and devices: Secondary | ICD-10-CM

## 2018-02-20 DIAGNOSIS — G4733 Obstructive sleep apnea (adult) (pediatric): Secondary | ICD-10-CM | POA: Diagnosis not present

## 2018-02-20 DIAGNOSIS — S060X9S Concussion with loss of consciousness of unspecified duration, sequela: Secondary | ICD-10-CM

## 2018-02-20 NOTE — Progress Notes (Signed)
Subjective:    Patient ID: Luis Boone is a 67 y.o. male.  HPI     Interim history:   Luis Boone is a 67 year old right-handed gentleman with an underlying medical history of hypertension, hyperlipidemia, hiatal hernia, peptic ulcer disease, depression, gout, mitral valve repair, arthritis and status post arthroscopic knee surgeries, as well as obesity, who presents for a new patient visit for recurrent headaches. He is referred by his primary care physician and practitioner. He is accompanied by his wife today. He has been compliant with CPAP and I last saw him for this on 08/04/2017, at which time he was fully compliant with CPAP therapy.  Today, 02/20/2018: He reports that he feels slightly improved, he has improved with time. After his initial fall in April he could not using CPAP regularly. He was worried about not being able to use his CPAP according to the wife. He tries to hydrate better. He likes to drink diet sodas maybe 2 cans per day, has actually reduced. Of note, he had a fall in April and hit his head, he had some loss of consciousness of unknown duration. I reviewed the ER records from 01/01/2018. He had fallen backwards on stairs at church, he hit his head on the back and sustained a laceration and had loss of consciousness. He had a head CT without contrast as well as cervical spine CT without contrast on 414 [id number] and I reviewed the results: IMPRESSION: CT head: Mild atrophy with mild patchy periventricular small vessel disease. No acute infarct. No mass or hemorrhage.   Foci of arterial vascular calcification noted. Small parietal and occipital scalp hematoma on the right. No fracture. There are areas of paranasal sinus disease.   CT cervical spine: No fracture or spondylolisthesis. Areas of osteoarthritic change, most notably at C5-6. There are foci of carotid artery calcification bilaterally.  His primary care nurse practitioner also ordered recent brain MRI. He had a  brain MRI without contrast on 01/14/2018 and I reviewed the results: IMPRESSION: Two foci of nonacute microhemorrhage in the right cerebrum. It is unclear if these are related to the recent injury. No other posttraumatic finding. No reversible finding to explain headache.  He has some residual headache nearly daily but not severe. He takes Tylenol as needed, up to twice a day. He has some neck pain radiating to both sides, no one-sided neurological symptoms, no photophobia, no nausea or vomiting, no blurry vision or double vision, new eyeglasses have changed and prescription just a little recently.  The patient's allergies, current medications, family history, past medical history, past social history, past surgical history and problem list were reviewed and updated as appropriate.    Previously (copied from previous notes for reference):   I saw him on 02/01/2017, at which time he reported doing well, he had adjusted well to CPAP therapy and indicated improvement of his sleep quality and daytime somnolence and did not feel he needed to take a nap on a day-to-day basis any longer. He was encouraged to continue with CPAP therapy and he was compliant at the time.    I reviewed his CPAP compliance data from 07/04/2017 through 08/02/2017 which is a total of 30 days, during which time he used his CPAP every night with percent used days greater than 4 hours at 100%, indicating superb compliance with an average usage of 7 hours and 12 minutes, residual AHI at goal at 2.3 per hour, leak acceptable with the 95th percentile at 14.1 L/m on a  pressure of 15 cm with EPR of 3. He reports that in the past 3 weeks, he has not slept very well, will be getting a new mask and hopes, it will help.    I first met him on 08/31/2016 at the request of his primary care physician, at which time the patient reported snoring, excessive daytime somnolence as well as morning headaches. I invited him for sleep study testing. He had  a split-night sleep study on 11/14/2016. I went over his test results with him in detail today. Baseline sleep efficiency was 73.4%, sleep latency 18.5 minutes, REM was absent. He had no slow-wave sleep. Total AHI was 58.8 per hour, average oxygen saturation was 92%, nadir was 85%. No significant PLMS were noted. He was titrated on CPAP with a pressure of 5 cm to 15 cm. AHI was 0 per hour on the final pressure, O2 nadir of 95% and supine non-REM sleep was achieved. He achieved 30.4% of slow-wave sleep and REM sleep at 7.8% during the titration portion of the study. Average oxygen saturation was 94%, nadir was 91%. He had no significant PLMS during the second part of the study, no significant EKG changes. He was fitted with a medium full face mask which he preferred over the nasal mask. Based on his test results are prescribed CPAP therapy for home use.   I reviewed his CPAP compliance data from 01/01/2017 through 01/30/2017 which is a total of 30 days, during which time he used his CPAP every night with percent used days greater than 4 hours at 100%, indicating superb compliance with an average usage of 7 hours and 5 minutes, residual AHI 4.2 per hour, leak acceptable for the 95th percentile at 8 L/m on a pressure of 15 cm with EPR of 3.    08/31/16: He reports snoring and excessive daytime somnolence as well as morning headaches. I reviewed your office note from 08/26/2016, which you kindly included. 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 He has 2 grown children (one adopted, one biological). He does not smoke or drink alcohol. He is going to retire by the end of this year. Bedtime is about 10:00 to 10:30 PM, and likes to read on his [place], is asleep latest by 11 PM.  His Rise time is 5:15 AM. He does not always wake up rested. He has had fairly frequent morning headaches in the past few months, snoring has become worse in the past 12-18 months and he has also been  noted to sleep talking the past 12 months per wife's report. His Epworth sleepiness score is 15 out of 24 today, his fatigue score is 41 out of 63. His sister may have obstructive sleep apnea and uses a CPAP machine as far as he can recall. He would be willing to try CPAP therapy. He denies restless legs symptoms and is not sure if he twitches or kicks his legs in his sleep. He has nocturia about 2-3 times per average night. Weight has been fluctuating. He is working on weight loss. His dentist made him aware that his airway is crowded and small and that he may need to be tested for sleep apnea. He had recent blood work in your office on 08/19/2016 and I reviewed the results. PSA was 0.502, lipid profile was good with total cholesterol at 114, LDL 55, HDL 44, liver function test normal, BUN and creatinine normal.  He is a nonsmoker, does not drink  alcohol, drinks caffeine in limitation, usually one per day in the morning in the form of coffee.  His Past Medical History Is Significant For: Past Medical History:  Diagnosis Date  . Allergy    seasonal  . Arthritis   . Depression   . GERD (gastroesophageal reflux disease)   . Gout   . Gout   . Heart murmur    heart valve repair  . Hiatal hernia   . HLD (hyperlipidemia)   . Hypertension   . Mitral murmur   . Obesity   . Ocular herpes simplex   . PUD (peptic ulcer disease)     His Past Surgical History Is Significant For: Past Surgical History:  Procedure Laterality Date  . COLONOSCOPY    . KNEE ARTHROSCOPY Right 2006  . MITRAL VALVE REPAIR  2004    His Family History Is Significant For: Family History  Problem Relation Age of Onset  . Migraines Mother   . Hypertension Mother   . Dementia Mother   . Cancer Father   . Arthritis Father   . Heart attack Father   . Colon cancer Neg Hx     His Social History Is Significant For: Social History   Socioeconomic History  . Marital status: Unknown    Spouse name: Not on file  .  Number of children: Not on file  . Years of education: Not on file  . Highest education level: Not on file  Occupational History  . Not on file  Social Needs  . Financial resource strain: Not on file  . Food insecurity:    Worry: Not on file    Inability: Not on file  . Transportation needs:    Medical: Not on file    Non-medical: Not on file  Tobacco Use  . Smoking status: Never Smoker  . Smokeless tobacco: Never Used  Substance and Sexual Activity  . Alcohol use: No    Alcohol/week: 0.0 oz  . Drug use: No  . Sexual activity: Not on file  Lifestyle  . Physical activity:    Days per week: Not on file    Minutes per session: Not on file  . Stress: Not on file  Relationships  . Social connections:    Talks on phone: Not on file    Gets together: Not on file    Attends religious service: Not on file    Active member of club or organization: Not on file    Attends meetings of clubs or organizations: Not on file    Relationship status: Not on file  Other Topics Concern  . Not on file  Social History Narrative  . Not on file    His Allergies Are:  Allergies  Allergen Reactions  . Prilosec [Omeprazole] Rash  . Vicodin [Hydrocodone-Acetaminophen] Rash  . Other     Medication to sedate him for a colonoscopy.  :   His Current Medications Are:  Outpatient Encounter Medications as of 02/20/2018  Medication Sig  . allopurinol (ZYLOPRIM) 100 MG tablet Take 100 mg by mouth daily.  Marland Kitchen aspirin 81 MG tablet Take 81 mg by mouth daily.  Marland Kitchen atorvastatin (LIPITOR) 40 MG tablet Take 40 mg by mouth daily.  . colchicine (COLCRYS) 0.6 MG tablet Take 0.6 mg by mouth daily.  Marland Kitchen loratadine (CLARITIN) 10 MG tablet Take 10 mg by mouth daily.  . metoprolol succinate (TOPROL-XL) 25 MG 24 hr tablet Take 25 mg by mouth daily.  . [DISCONTINUED] methocarbamol (ROBAXIN) 500 MG  tablet Take 1 tablet (500 mg total) by mouth at bedtime and may repeat dose one time if needed.  . [DISCONTINUED]  oxyCODONE-acetaminophen (PERCOCET/ROXICET) 5-325 MG tablet Take 1 tablet by mouth every 4 (four) hours as needed for severe pain.   No facility-administered encounter medications on file as of 02/20/2018.   :  Review of Systems:  Out of a complete 14 point review of systems, all are reviewed and negative with the exception of these symptoms as listed below:  Review of Systems  Neurological:       Pt presents today to discuss his fall. He fell last month and hit his head. Pt has had recurrent headaches after the fall. Pt has had a hard time tolerating his cpap after the fall.    Objective:  Neurological Exam  Physical Exam Physical Examination:   Vitals:   02/20/18 1429  BP: (!) 141/83  Pulse: 70   General Examination: The patient is a very pleasant 67 y.o. male in no acute distress. He appears well-developed and well-nourished and well groomed.   HEENT:Normocephalic, no visible or palpable scar from the staples, pupils are equal, round and reactive to light and accommodation.Corrective eyeglasses in place.Extraocular tracking is good without limitation to gaze excursion or nystagmus noted. Normal smooth pursuit is noted. Hearing is grossly intact. Face is symmetric with normal facial animation and normal facial sensation. Speech is clear with no dysarthria noted. There is no hypophonia. There is no lip, neck/head, jaw or voice tremor. Neck is supple with full range of passive and active motion. Oropharynx exam reveals: mildmouth dryness, adequatedental hygiene and markedairway crowding, Mallampati is class III. Tongue protrudes centrally and palate elevates symmetrically.   Chest:Clear to auscultation without wheezing, rhonchi or crackles noted.  Heart:S1+S2+0, regular and normal without murmurs, rubs or gallops noted.   Abdomen:Soft, non-tender and non-distended with normal bowel sounds appreciated on auscultation.  Extremities:There is nopitting edema in the distal  lower extremities bilaterally.   Skin: Warm and dry without trophic changes noted. There are no varicose veins.  Musculoskeletal: exam reveals no obvious joint deformities, tenderness or joint swelling or erythema.   Neurologically:  Mental status: The patient is awake, alert and oriented in all 4 spheres. Hisimmediate and remote memory, attention, language skills and fund of knowledge are appropriate. There is no evidence of aphasia, agnosia, apraxia or anomia. Speech is clear with normal prosody and enunciation. Thought process is linear. Mood is normaland affect is normal.  Cranial nerves II - XII are as described above under HEENT exam.  Motor exam: Normal bulk, strength and tone is noted. There is no drift,restingtremor. Reflexes are 1+ throughout. Fine motor skills and coordination:grosslyintact.  Cerebellar testing: No dysmetria or intention tremor. There is no truncal or gait ataxia.  Sensory exam: intact to light touch.  Gait, station and balance: Hestands easily. No veering to one side is noted. No leaning to one side is noted. Posture is age-appropriate and stance is narrow based. Gait shows normalstride length and normalpace. Tandem walk is slightly challenging for him.   Assessment and Plan:   In summary, Sydney Azure Brownis a very pleasant 67 year old malewith an underlying medical history of hypertension, hyperlipidemia, hiatal hernia, peptic ulcer disease, depression, gout, mitral valve repair, arthritis and status post arthroscopic knee surgeries, as well as obesity, whopresents for concern for concussion. He had a fall in April with loss of consciousness. He hit his head, he had subsequent CT head and cervical spine. He does have degenerative  changes in his cervical spine, none of these changes are acute. He had a subsequent brain MRI without contrast which showed nonspecific findings, no acute or subacute injuries thankfully. We talked about the importance of rest and  time for concussion. He is advised to continue to use his CPAP, he is now back on full compliance with his CPAP, was not able to use his CPAP after the concussion for about 2 weeks or so. Of note, he had a split-night sleep study on 11/14/2016 (with baseline AHI of 58.8 per hour, O2 nadir of 85%). He has done rather well with CPAP of 15 cm and is commended for his treatment adherence. We talked about the importance of good hydration. He is encouraged to continue to give it some time. I did not suggest any new medications. I would like for him to avoid taking Tylenol multiple times a day for fear of side effects and he is advised to be cautious with his caffeine intake, he is currently not overdoing it by any means. I suggested we keep his routine follow-up appointment for November. His neurological exam is nonfocal and reassuring. I answered all their questions today and the patient and his wife were in agreement. I spent 30 minutes in total face-to-face time with the patient, more than 50% of which was spent in counseling and coordination of care, reviewing test results, reviewing medication and discussing or reviewing the diagnosis of concussion, its prognosis and treatment options. Pertinent laboratory and imaging test results that were available during this visit with the patient were reviewed by me and considered in my medical decision making (see chart for details).

## 2018-02-20 NOTE — Patient Instructions (Addendum)
Post-concussion symptoms can include headache, dizziness, cognitive issues, balance issues, mood related symptoms, visual disturbances, ringing in the ears, sleeplessness or sleepiness, and motor issues. These can last for weeks and sometimes months after the initial head injury. Concussion is a mild form of traumatic brain injury, and usually occurs after a blow to the head. Loss of consciousness is not a requirement for the diagnosis of concussion or of postconcussion syndrome. The risk of post concussive symptoms does not appear to be correlated with the severity of the initial injury. Most people who have a concussion are without any residual symptoms. Some patients have symptoms that occur within the first 7-10 days of the initial injury and have had complete resolution of the symptoms within 3 months.  However, keep in mind that postconcussive symptoms can persist for a year and sometimes even longer. There is no specific test for the diagnosis of concussion. There is no specific treatment for concussion or postconcussive symptoms and care is supportive. Treatment is geared towards improving symptoms. Medications commonly used for migraines or tension headaches, including some antidepressants, appear to be helpful with postconcussive headaches. Medications include amitriptyline, Topamax, or gabapentin and others. Keep in mind that overuse of over-the-counter pain medications can exacerbate post concussion headaches.  There are no medications currently recommended specifically for cognitive complaints after mild traumatic brain injury. Usually, time is the best treatment for postconcussive cognitive issues as most of the cognitive complaints resolve on their own in the first few weeks or months after the injury. Sometimes (usually for persistent symptoms) a referral to a psychiatrist or psychologist is helpful. Certain forms of cognitive therapy may be helpful, and relaxation therapy may also help. Please  remember, common headache triggers are: sleep deprivation, dehydration, overheating, stress, hypoglycemia or skipping meals and blood sugar fluctuations, excessive pain medications or excessive alcohol use or caffeine withdrawal. Some people have food triggers such as aged cheese, orange juice or chocolate, especially dark chocolate, or MSG (monosodium glutamate). Try to avoid these headache triggers as much possible. It may be helpful to keep a headache diary to figure out what makes your headaches worse or brings them on and what alleviates them. Some people report headache onset after exercise but studies have shown that regular exercise may actually prevent headaches from coming. If you have exercise-induced headaches, please make sure that you drink plenty of fluid before and after exercising and that you do not over do it and do not overheat.  You have done a great job with your CPAP.  Avoid caffeine in excess as you can make your headaches worse.  Your neurological exam looks good. Your scans were good.  I would not recommend any new medication as yet. Keep your follow up in November.

## 2018-03-21 ENCOUNTER — Encounter: Payer: Self-pay | Admitting: Neurology

## 2018-03-21 DIAGNOSIS — G44309 Post-traumatic headache, unspecified, not intractable: Secondary | ICD-10-CM | POA: Diagnosis not present

## 2018-03-21 DIAGNOSIS — G4733 Obstructive sleep apnea (adult) (pediatric): Secondary | ICD-10-CM | POA: Diagnosis not present

## 2018-03-21 DIAGNOSIS — Z6838 Body mass index (BMI) 38.0-38.9, adult: Secondary | ICD-10-CM | POA: Diagnosis not present

## 2018-03-21 DIAGNOSIS — I10 Essential (primary) hypertension: Secondary | ICD-10-CM | POA: Diagnosis not present

## 2018-03-21 DIAGNOSIS — Z1389 Encounter for screening for other disorder: Secondary | ICD-10-CM | POA: Diagnosis not present

## 2018-03-21 DIAGNOSIS — N183 Chronic kidney disease, stage 3 (moderate): Secondary | ICD-10-CM | POA: Diagnosis not present

## 2018-05-30 DIAGNOSIS — G4733 Obstructive sleep apnea (adult) (pediatric): Secondary | ICD-10-CM | POA: Diagnosis not present

## 2018-08-02 ENCOUNTER — Encounter: Payer: Self-pay | Admitting: Neurology

## 2018-08-07 ENCOUNTER — Encounter: Payer: Self-pay | Admitting: Neurology

## 2018-08-07 ENCOUNTER — Ambulatory Visit: Payer: PPO | Admitting: Neurology

## 2018-08-07 VITALS — BP 128/81 | HR 68 | Ht 67.5 in | Wt 254.0 lb

## 2018-08-07 DIAGNOSIS — Z9989 Dependence on other enabling machines and devices: Secondary | ICD-10-CM

## 2018-08-07 DIAGNOSIS — G4733 Obstructive sleep apnea (adult) (pediatric): Secondary | ICD-10-CM

## 2018-08-07 NOTE — Patient Instructions (Addendum)
Please continue using your CPAP regularly. While your insurance requires that you use CPAP at least 4 hours each night on 70% of the nights, I recommend, that you not skip any nights and use it throughout the night if you can. Getting used to CPAP and staying with the treatment long term does take time and patience and discipline. Untreated obstructive sleep apnea when it is moderate to severe can have an adverse impact on cardiovascular health and raise her risk for heart disease, arrhythmias, hypertension, congestive heart failure, stroke and diabetes. Untreated obstructive sleep apnea causes sleep disruption, nonrestorative sleep, and sleep deprivation. This can have an impact on your day to day functioning and cause daytime sleepiness and impairment of cognitive function, memory loss, mood disturbance, and problems focussing. Using CPAP regularly can improve these symptoms.   You can try Melatonin at night for sleep: take 1 mg to 3 mg, one to 2 hours before your bedtime. You can go up to 5 mg if needed. It is over the counter and comes in pill form, chewable form and spray, if you prefer.    Keep up the good work! We can see you in 1 year, you can see one of our nurse practitioners.

## 2018-08-07 NOTE — Progress Notes (Signed)
Subjective:    Patient ID: Luis Boone is a 67 y.o. male.  HPI     Interim history:   Mr. Bourbon is a 67 year old right-handed gentleman with an underlying medical history of hypertension, hyperlipidemia, hiatal hernia, peptic ulcer disease, depression, gout, mitral valve repair, arthritis and status post arthroscopic knee surgeries, as well as obesity, who presents for follow-up consultation of his obstructive sleep apnea and recurrent headaches. The patient is unaccompanied today. I last saw him on 02/20/2018 for a sooner than scheduled appointment because of a fall in April which resulted in reported loss of consciousness and he had hit his head. We reviewed ER records and imaging studies at the time. He did not have any acute abnormalities on CT scans of his head and neck, I did not suggest any new medications at the time, and cautioned him regarding the daily use of Tylenol. He was back on track with his CPAP at the time.  Today, 08/07/2018: I reviewed his CPAP compliance data from 07/04/2018 through 08/02/2018, which is a total of 30 days, during which time he used his CPAP every night with percent used days greater than 4 hours at 97%, indicating excellent compliance. Average usage of 7 hours and 16 minutes, residual AHI at goal at 2.3 per hour, leak acceptable with the 95th percentile at 15.6 L/m on a pressure of 15 cm with EPR of 3. He reports doing okay with the CPAP. He has had some difficulty sleeping, not so much falling asleep, but wakes up and is up for over an hour at times.  No significant nocturia. No PLMs, no RLS type Sx. Does have more stress, what with 76 yo mom now in memory care. Did have a recent fall a couple of months ago, tripped and fell forward, bruised L knee. He is the second oldest of 5 total siblings, 4 of them local and visit mom regularly.   The patient's allergies, current medications, family history, past medical history, past social history, past surgical history  and problem list were reviewed and updated as appropriate.    Previously (copied from previous notes for reference):   I saw him for this on 08/04/2017, at which time he was fully compliant with CPAP therapy.  I saw him on 02/01/2017, at which time he reported doing well, he had adjusted well to CPAP therapy and indicated improvement of his sleep quality and daytime somnolence and did not feel he needed to take a nap on a day-to-day basis any longer. He was encouraged to continue with CPAP therapy and he was compliant at the time.    I reviewed his CPAP compliance data from 07/04/2017 through 08/02/2017 which is a total of 30 days, during which time he used his CPAP every night with percent used days greater than 4 hours at 100%, indicating superb compliance with an average usage of 7 hours and 12 minutes, residual AHI at goal at 2.3 per hour, leak acceptable with the 95th percentile at 14.1 L/m on a pressure of 15 cm with EPR of 3. He reports that in the past 3 weeks, he has not slept very well, will be getting a new mask and hopes, it will help.    I first met him on 08/31/2016 at the request of his primary care physician, at which time the patient reported snoring, excessive daytime somnolence as well as morning headaches. I invited him for sleep study testing. He had a split-night sleep study on 11/14/2016. I went over  his test results with him in detail today. Baseline sleep efficiency was 73.4%, sleep latency 18.5 minutes, REM was absent. He had no slow-wave sleep. Total AHI was 58.8 per hour, average oxygen saturation was 92%, nadir was 85%. No significant PLMS were noted. He was titrated on CPAP with a pressure of 5 cm to 15 cm. AHI was 0 per hour on the final pressure, O2 nadir of 95% and supine non-REM sleep was achieved. He achieved 30.4% of slow-wave sleep and REM sleep at 7.8% during the titration portion of the study. Average oxygen saturation was 94%, nadir was 91%. He had no significant  PLMS during the second part of the study, no significant EKG changes. He was fitted with a medium full face mask which he preferred over the nasal mask. Based on his test results are prescribed CPAP therapy for home use.   I reviewed his CPAP compliance data from 01/01/2017 through 01/30/2017 which is a total of 30 days, during which time he used his CPAP every night with percent used days greater than 4 hours at 100%, indicating superb compliance with an average usage of 7 hours and 5 minutes, residual AHI 4.2 per hour, leak acceptable for the 95th percentile at 8 L/m on a pressure of 15 cm with EPR of 3.    08/31/16: He reports snoring and excessive daytime somnolence as well as morning headaches. I reviewed your office note from 08/26/2016, which you kindly included.  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 He has 2 grown children (one adopted, one biological). He does not smoke or drink alcohol. He is going to retire by the end of this year. Bedtime is about 10:00 to 10:30 PM, and likes to read on his [place], is asleep latest by 11 PM.  His Rise time is 5:15 AM. He does not always wake up rested. He has had fairly frequent morning headaches in the past few months, snoring has become worse in the past 12-18 months and he has also been noted to sleep talking the past 12 months per wife's report. His Epworth sleepiness score is 15 out of 24 today, his fatigue score is 41 out of 63. His sister may have obstructive sleep apnea and uses a CPAP machine as far as he can recall. He would be willing to try CPAP therapy. He denies restless legs symptoms and is not sure if he twitches or kicks his legs in his sleep. He has nocturia about 2-3 times per average night. Weight has been fluctuating. He is working on weight loss. His dentist made him aware that his airway is crowded and small and that he may need to be tested for sleep apnea. He had recent blood work in your office  on 08/19/2016 and I reviewed the results. PSA was 0.502, lipid profile was good with total cholesterol at 114, LDL 55, HDL 44, liver function test normal, BUN and creatinine normal.  He is a nonsmoker, does not drink alcohol, drinks caffeine in limitation, usually one per day in the morning in the form of coffee.  His Past Medical History Is Significant For: Past Medical History:  Diagnosis Date  . Allergy    seasonal  . Arthritis   . Depression   . GERD (gastroesophageal reflux disease)   . Gout   . Gout   . Heart murmur    heart valve repair  . Hiatal hernia   . HLD (hyperlipidemia)   .  Hypertension   . Mitral murmur   . Obesity   . Ocular herpes simplex   . PUD (peptic ulcer disease)     Her Past Surgical History Is Significant For: Past Surgical History:  Procedure Laterality Date  . COLONOSCOPY    . KNEE ARTHROSCOPY Right 2006  . MITRAL VALVE REPAIR  2004    His Family History Is Significant For: Family History  Problem Relation Age of Onset  . Migraines Mother   . Hypertension Mother   . Dementia Mother   . Cancer Father   . Arthritis Father   . Heart attack Father   . Colon cancer Neg Hx     His Social History Is Significant For: Social History   Socioeconomic History  . Marital status: Unknown    Spouse name: Not on file  . Number of children: Not on file  . Years of education: Not on file  . Highest education level: Not on file  Occupational History  . Not on file  Social Needs  . Financial resource strain: Not on file  . Food insecurity:    Worry: Not on file    Inability: Not on file  . Transportation needs:    Medical: Not on file    Non-medical: Not on file  Tobacco Use  . Smoking status: Never Smoker  . Smokeless tobacco: Never Used  Substance and Sexual Activity  . Alcohol use: No    Alcohol/week: 0.0 standard drinks  . Drug use: No  . Sexual activity: Not on file  Lifestyle  . Physical activity:    Days per week: Not on file     Minutes per session: Not on file  . Stress: Not on file  Relationships  . Social connections:    Talks on phone: Not on file    Gets together: Not on file    Attends religious service: Not on file    Active member of club or organization: Not on file    Attends meetings of clubs or organizations: Not on file    Relationship status: Not on file  Other Topics Concern  . Not on file  Social History Narrative  . Not on file    His Allergies Are:  Allergies  Allergen Reactions  . Prilosec [Omeprazole] Rash  . Vicodin [Hydrocodone-Acetaminophen] Rash  . Other     Medication to sedate him for a colonoscopy.  :   His Current Medications Are:  Outpatient Encounter Medications as of 08/07/2018  Medication Sig  . allopurinol (ZYLOPRIM) 100 MG tablet Take 100 mg by mouth daily.  Marland Kitchen aspirin 81 MG tablet Take 81 mg by mouth daily.  Marland Kitchen atorvastatin (LIPITOR) 40 MG tablet Take 40 mg by mouth daily.  . colchicine (COLCRYS) 0.6 MG tablet Take 0.6 mg by mouth daily.  . metoprolol succinate (TOPROL-XL) 25 MG 24 hr tablet Take 25 mg by mouth daily.  . [DISCONTINUED] loratadine (CLARITIN) 10 MG tablet Take 10 mg by mouth daily.   No facility-administered encounter medications on file as of 08/07/2018.   :  Review of Systems:  Out of a complete 14 point review of systems, all are reviewed and negative with the exception of these symptoms as listed below: Review of Systems  Neurological:       Pt presents today to discuss his cpap. Pt reports that he has been having trouble sleeping.    Objective:  Neurological Exam  Physical Exam Physical Examination:   Vitals:   08/07/18 0913  BP: 128/81  Pulse: 68   General Examination: The patient is a very pleasant 67 y.o. male in no acute distress. He appears well-developed and well-nourished and well groomed.   HEENT:Normocephalic, no visible or palpable scar from the staples, pupils are equal, round and reactive to light and  accommodation.Corrective eyeglasses in place. Extraocular tracking is good without limitation to gaze excursion or nystagmus noted. Normal smooth pursuit is noted. Hearing is grossly intact. Face is symmetric with normal facial animation and normal facial sensation. Speech is clear with no dysarthria noted. There is no hypophonia. There is no lip, neck/head, jaw or voice tremor. Neck is shows FROM. Oropharynx exam reveals: mildmouth dryness, adequatedental hygiene and markedairway crowding. Tongue protrudes centrally and palate elevates symmetrically.   Chest:Clear to auscultation without wheezing, rhonchi or crackles noted.  Heart:S1+S2+0, regular and normal without murmurs, rubs or gallops noted.   Abdomen:Soft, non-tender and non-distended with normal bowel sounds appreciated on auscultation.  Extremities:There is nopitting edema in the distal lower extremities bilaterally.   Skin: Warm and dry without trophic changes noted. There are no varicose veins.  Musculoskeletal: exam reveals no obvious joint deformities, tenderness or joint swelling or erythema.   Neurologically:  Mental status: The patient is awake, alert and oriented in all 4 spheres. Hisimmediate and remote memory, attention, language skills and fund of knowledge are appropriate. There is no evidence of aphasia, agnosia, apraxia or anomia. Speech is clear with normal prosody and enunciation. Thought process is linear. Mood is normaland affect is normal.  Cranial nerves II - XII are as described above under HEENT exam.  Motor exam: Normal bulk, strength and tone is noted. There is no tremor. Reflexes are 1+ throughout. Fine motor skills and coordination:grosslyintact.  Cerebellar testing: No dysmetria or intention tremor. There is no truncal or gait ataxia.  Sensory exam: intact to light touch.  Gait, station and balance: Hestands easily. No veering to one side is noted. No leaning to one side is noted. Posture  is age-appropriate and stance is narrow based. Gait shows normalstride length and normalpace. Tandem walk is slightly challenging for him, not new.   Assessment and Plan:   In summary, Ebenezer Mccaskey Brownis a very pleasant 67 year old malewith an underlying medical history of hypertension, hyperlipidemia, hiatal hernia, peptic ulcer disease, depression, gout, mitral valve repair, arthritis and status post arthroscopic knee surgeries, as well as obesity, whopresents for follow-up consultation of his obstructive sleep apnea for his yearly checkup. He had a fall in April 2019 with laceration to the head, CT scans showed degenerative changes and chronic changes but nothing acute thankfully. His headaches have improved, he does not take Tylenol on a daily basis. He is fully compliant with CPAP and how recommended for this. He's had some interim stress increase and has had some difficulty maintaining sleep. He is advised to try melatonin. He is advised to continue to use his CPAP. Of note, he had a split-night sleep study on 11/14/2016(withbaseline AHI of 58.8 per hour, O2 nadirof85%). He has done rather well with CPAP of 15 cm, and uses a fullface mask. He is advised to follow-up routinely in one year, he can see one of our nurse practitioners. We talked about the importance of fall prevention. I answered all his questions today and he was in agreement. I spent 20 minutes in total face-to-face time with the patient, more than 50% of which was spent in counseling and coordination of care, reviewing test results, reviewing medication and discussing or reviewing  the diagnosis of OSA, its prognosis and treatment options. Pertinent laboratory and imaging test results that were available during this visit with the patient were reviewed by me and considered in my medical decision making (see chart for details).

## 2018-08-18 ENCOUNTER — Encounter (HOSPITAL_COMMUNITY): Payer: Self-pay | Admitting: Emergency Medicine

## 2018-08-18 ENCOUNTER — Ambulatory Visit (HOSPITAL_COMMUNITY)
Admission: EM | Admit: 2018-08-18 | Discharge: 2018-08-18 | Disposition: A | Discharge status: Home / Self Care | Payer: PPO | Attending: Family Medicine | Admitting: Family Medicine

## 2018-08-18 ENCOUNTER — Ambulatory Visit (INDEPENDENT_AMBULATORY_CARE_PROVIDER_SITE_OTHER): Payer: PPO

## 2018-08-18 DIAGNOSIS — M25562 Pain in left knee: Secondary | ICD-10-CM

## 2018-08-18 DIAGNOSIS — S8002XA Contusion of left knee, initial encounter: Secondary | ICD-10-CM

## 2018-08-18 DIAGNOSIS — M7989 Other specified soft tissue disorders: Secondary | ICD-10-CM | POA: Diagnosis not present

## 2018-08-18 DIAGNOSIS — S8992XA Unspecified injury of left lower leg, initial encounter: Secondary | ICD-10-CM | POA: Diagnosis not present

## 2018-08-18 MED ORDER — TRAMADOL HCL 50 MG PO TABS: 50.0000 mg | ORAL_TABLET | Freq: Four times a day (QID) | ORAL | 0 refills | Status: DC | PRN

## 2018-08-18 NOTE — ED Triage Notes (Signed)
Here for left knee inj onset 3 days ago... Reports he fell down a flight of stairs while carrying folding chairs.   Sx inlclude swelling, hematoma to left arm from fall  Denies head inj/LOC  A&O x4... Ambulatory

## 2018-08-18 NOTE — Discharge Instructions (Signed)
Be aware, pain medications may cause drowsiness. Please do not drive, operate heavy machinery or make important decisions while on this medication, it can cloud your judgement.  

## 2018-08-18 NOTE — ED Provider Notes (Signed)
[facility]   [id number] 08/18/18 Arrival Time: 4403  ASSESSMENT & PLAN:  1. Contusion of left knee, initial encounter   2. Acute pain of left knee    Discussed that I cannot r/o internal derangement of knee nor a significant ligamentous injury with plain films done today. If this pain continues or worsens with or without new symptoms he may need additional imaging such as an MRI. No current instability of knee appreciated today. He has seen Dr Sharol Given in the past.  I have personally viewed the imaging studies ordered this visit. No fractures or dislocations seen.  Imaging: Dg Knee Complete 4 Views Left  Result Date: 08/18/2018 CLINICAL DATA:  Medial and posterior left knee pain and swelling after falling down stairs 2 days ago. EXAM: LEFT KNEE - COMPLETE 4+ VIEW COMPARISON:  None. FINDINGS: No evidence of fracture, dislocation, or joint effusion. There is moderate arthritis of the patellofemoral compartment. IMPRESSION: No acute abnormalities. Arthritis of the patellofemoral compartment. Electronically Signed   By: Lorriane Shire M.D.   On: 08/18/2018 10:09   Meds ordered this encounter  Medications  . traMADol (ULTRAM) 50 MG tablet    Sig: Take 1 tablet (50 mg total) by mouth every 6 (six) hours as needed.    Dispense:  15 tablet    Refill:  0   Follow-up Information    Schedule an appointment as soon as possible for a visit  with Newt Minion, MD.   Specialty:  Orthopedic Surgery Contact information: [address] [place] [address] [phone]          Rest the injured area as much as practical. WBAT. Medication sedation precautions given.  Reviewed expectations re: course of current medical issues. Questions answered. Outlined signs and symptoms indicating need for more acute intervention. Patient verbalized understanding. After Visit Summary given.  SUBJECTIVE: History from: patient. Luis Boone is a 67 y.o. male who reports  persistent moderate pain of his left knee; described as aching with occasional "sharp shooting pain" with certain movement. Injury/trama: yes, reports falling onto L knee 2 days ago. Able to bear weight immediately and since but with pain. Symptoms have progressed to a point and plateaued since beginning. No locking of knee or giving out of knee reported. Mild swelling that has stabilized. Relieved by: certain movements and weight bearing. Worsened by: rest and keeping LLE extended. Associated symptoms: none reported. Extremity sensation changes or weakness: none. Self treatment: Tylenol without relief. History of similar: no. No h/o knee surgery reported.  Past Surgical History:  Procedure Laterality Date  . COLONOSCOPY    . KNEE ARTHROSCOPY Right 2006  . MITRAL VALVE REPAIR  2004    ROS: As per HPI. All other systems negative.   OBJECTIVE:  Vitals:   08/18/18 0953  BP: 115/65  Pulse: 69  Resp: 20  Temp: 98 F (36.7 C)  TempSrc: Oral  SpO2: 98%    General appearance: alert; no distress HEENT: [place]; AT Lungs: unlabored; able to speak full sentences Extremities: . LLE: warm and well perfused; poorly localized marked tenderness over left medial knee; without gross deformities; with mild swelling; with no bruising; ROM: normal; pain with active flexion after extending LLE; no calf swelling CV: brisk extremity capillary refill of LLE; can palpate DP/PT pulse of LLE. Skin: warm and dry; no visible rashes Neurologic: gait but favors LLE; normal reflexes of LLE; normal sensation of LLE; normal strength of LLE Psychological: alert and cooperative; normal mood and  affect  Allergies  Allergen Reactions  . Prilosec [Omeprazole] Rash  . Vicodin [Hydrocodone-Acetaminophen] Rash  . Other     Medication to sedate him for a colonoscopy.    Past Medical History:  Diagnosis Date  . Allergy    seasonal  . Arthritis   . Depression   . GERD (gastroesophageal reflux disease)   . Gout    . Gout   . Heart murmur    heart valve repair  . Hiatal hernia   . HLD (hyperlipidemia)   . Hypertension   . Mitral murmur   . Obesity   . Ocular herpes simplex   . PUD (peptic ulcer disease)    Social History   Socioeconomic History  . Marital status: Unknown    Spouse name: Not on file  . Number of children: Not on file  . Years of education: Not on file  . Highest education level: Not on file  Occupational History  . Not on file  Social Needs  . Financial resource strain: Not on file  . Food insecurity:    Worry: Not on file    Inability: Not on file  . Transportation needs:    Medical: Not on file    Non-medical: Not on file  Tobacco Use  . Smoking status: Never Smoker  . Smokeless tobacco: Never Used  Substance and Sexual Activity  . Alcohol use: No    Alcohol/week: 0.0 standard drinks  . Drug use: No  . Sexual activity: Not on file  Lifestyle  . Physical activity:    Days per week: Not on file    Minutes per session: Not on file  . Stress: Not on file  Relationships  . Social connections:    Talks on phone: Not on file    Gets together: Not on file    Attends religious service: Not on file    Active member of club or organization: Not on file    Attends meetings of clubs or organizations: Not on file    Relationship status: Not on file  Other Topics Concern  . Not on file  Social History Narrative  . Not on file   Family History  Problem Relation Age of Onset  . Migraines Mother   . Hypertension Mother   . Dementia Mother   . Cancer Father   . Arthritis Father   . Heart attack Father   . Colon cancer Neg Hx    Past Surgical History:  Procedure Laterality Date  . COLONOSCOPY    . KNEE ARTHROSCOPY Right 2006  . MITRAL VALVE REPAIR  2004      Vanessa Kick, MD 08/18/18 1114

## 2018-08-21 ENCOUNTER — Encounter (INDEPENDENT_AMBULATORY_CARE_PROVIDER_SITE_OTHER): Payer: Self-pay | Admitting: Physician Assistant

## 2018-08-21 ENCOUNTER — Ambulatory Visit (INDEPENDENT_AMBULATORY_CARE_PROVIDER_SITE_OTHER): Payer: PPO

## 2018-08-21 ENCOUNTER — Ambulatory Visit (INDEPENDENT_AMBULATORY_CARE_PROVIDER_SITE_OTHER): Payer: PPO | Admitting: Physician Assistant

## 2018-08-21 VITALS — Ht 67.5 in | Wt 254.0 lb

## 2018-08-21 DIAGNOSIS — M25522 Pain in left elbow: Secondary | ICD-10-CM | POA: Diagnosis not present

## 2018-08-21 DIAGNOSIS — S8002XA Contusion of left knee, initial encounter: Secondary | ICD-10-CM | POA: Diagnosis not present

## 2018-08-21 DIAGNOSIS — S5002XA Contusion of left elbow, initial encounter: Secondary | ICD-10-CM | POA: Diagnosis not present

## 2018-08-22 ENCOUNTER — Encounter (INDEPENDENT_AMBULATORY_CARE_PROVIDER_SITE_OTHER): Payer: Self-pay | Admitting: Physician Assistant

## 2018-08-22 DIAGNOSIS — S8002XA Contusion of left knee, initial encounter: Secondary | ICD-10-CM | POA: Diagnosis not present

## 2018-08-22 DIAGNOSIS — M25522 Pain in left elbow: Secondary | ICD-10-CM | POA: Diagnosis not present

## 2018-08-22 DIAGNOSIS — S5002XA Contusion of left elbow, initial encounter: Secondary | ICD-10-CM | POA: Diagnosis not present

## 2018-08-22 MED ORDER — LIDOCAINE HCL 1 % IJ SOLN
5.0000 mL | INTRAMUSCULAR | Status: AC | PRN
  Administered 2018-08-22: 5 mL

## 2018-08-22 MED ORDER — METHYLPREDNISOLONE ACETATE 40 MG/ML IJ SUSP
40.0000 mg | INTRAMUSCULAR | Status: AC | PRN
  Administered 2018-08-22: 40 mg via INTRA_ARTICULAR

## 2018-08-22 NOTE — Progress Notes (Signed)
Office Visit Note   Patient: DAVIYON Boone           Date of Birth: 04-06-1951           MRN: [id number] Visit Date: 08/21/2018              Requested by: Prince Solian, MD [address] [place], [place] [address] PCP: Prince Solian, MD  Chief Complaint  Patient presents with  . Left Knee - Pain    New patient  . Left Elbow - Pain      HPI: The patient is a 67 yo gentleman who is seen following a fall down several stairs when trying to move a chair. He reports he was helping to move chairs on 08/16/2018  when he fell down the last 2 steps and struck his left/ /knee and elbow. He had immediate pain in the knee, but more delayed pain over the elbow over the next couple of days. He went to the ED due to the knee pain on 08/18/18 and xrays of the left knee did not show any acute fractures, dislocation or joint effusion. He does have moderate patello- femoral arthritis of the left knee. He tried Tramadol for the pain, but reports he developed a rash and had to stop this. He cannot take NSAID's due to kidney disease.   Assessment & Plan: Visit Diagnoses:  1. Pain in left elbow   2. Contusion of left elbow, initial encounter   3. Contusion of left knee, initial encounter     Plan: Counseled the patient that xrays of the left elbow are negative. Counseled to continue to ice to help with discomfort and continue to move the elbow to tolerance. The left knee was injected with lidocaine and Depo Medrol under sterile techniques and the patient tolerated this well. Counseled the patient that if he continues to have problems with the left knee, we may need to proceed with MRI scan to rule out internal derangement such as a cartilage tear or ligamentous injury. He will follow up in about 4 weeks if symptoms not resolved.   Follow-Up Instructions: Return in about 4 weeks (around 09/18/2018), or if symptoms worsen or fail to improve.   Ortho Exam  Patient is alert, oriented, no adenopathy,  well-dressed, normal affect, normal respiratory effort. Left elbow and distal upper arm with large medial ecchymosis, with tenderness to palpation over this area. Good full active range of motion of the elbow, shoulder and wrist.  Left knee with small effusion, tender over the medial joint line and with range of motion over the patello femoral groove. No instability with varus/valgus stress.   Imaging: No results found. No images are attached to the encounter.  Labs: No results found for: HGBA1C, ESRSEDRATE, CRP, LABURIC, REPTSTATUS, GRAMSTAIN, CULT, LABORGA   No results found for: ALBUMIN, PREALBUMIN, LABURIC  Body mass index is 39.19 kg/m.  Orders:  Orders Placed This Encounter  Procedures  . XR Elbow Complete Left (3+View)   No orders of the defined types were placed in this encounter.    Procedures: Large Joint Inj: L knee on 08/22/2018 10:59 AM Indications: pain and diagnostic evaluation Details: 22 G 1.5 in needle, anteromedial approach  Arthrogram: No  Medications: 5 mL lidocaine 1 %; 40 mg methylPREDNISolone acetate 40 MG/ML Outcome: tolerated well, no immediate complications Procedure, treatment alternatives, risks and benefits explained, specific risks discussed. Consent was given by the patient. Immediately prior to procedure a time out was called to verify the  correct patient, procedure, equipment, support staff and site/side marked as required. Patient was prepped and draped in the usual sterile fashion.      Clinical Data: No additional findings.  ROS:  All other systems negative, except as noted in the HPI. Review of Systems  Objective: Vital Signs: Ht 5' 7.5" (1.715 m)   Wt 254 lb (115.2 kg)   BMI 39.19 kg/m   Specialty Comments:  No specialty comments available.  PMFS History: There are no active problems to display for this patient.  Past Medical History:  Diagnosis Date  . Allergy    seasonal  . Arthritis   . Depression   . GERD  (gastroesophageal reflux disease)   . Gout   . Gout   . Heart murmur    heart valve repair  . Hiatal hernia   . HLD (hyperlipidemia)   . Hypertension   . Mitral murmur   . Obesity   . Ocular herpes simplex   . PUD (peptic ulcer disease)     Family History  Problem Relation Age of Onset  . Migraines Mother   . Hypertension Mother   . Dementia Mother   . Cancer Father   . Arthritis Father   . Heart attack Father   . Colon cancer Neg Hx     Past Surgical History:  Procedure Laterality Date  . COLONOSCOPY    . KNEE ARTHROSCOPY Right 2006  . MITRAL VALVE REPAIR  2004   Social History   Occupational History  . Not on file  Tobacco Use  . Smoking status: Never Smoker  . Smokeless tobacco: Never Used  Substance and Sexual Activity  . Alcohol use: No    Alcohol/week: 0.0 standard drinks  . Drug use: No  . Sexual activity: Not on file

## 2018-09-08 DIAGNOSIS — G4733 Obstructive sleep apnea (adult) (pediatric): Secondary | ICD-10-CM | POA: Diagnosis not present

## 2018-10-04 DIAGNOSIS — E669 Obesity, unspecified: Secondary | ICD-10-CM | POA: Diagnosis not present

## 2018-10-04 DIAGNOSIS — I10 Essential (primary) hypertension: Secondary | ICD-10-CM | POA: Diagnosis not present

## 2018-10-04 DIAGNOSIS — I129 Hypertensive chronic kidney disease with stage 1 through stage 4 chronic kidney disease, or unspecified chronic kidney disease: Secondary | ICD-10-CM | POA: Diagnosis not present

## 2018-10-04 DIAGNOSIS — N183 Chronic kidney disease, stage 3 (moderate): Secondary | ICD-10-CM | POA: Diagnosis not present

## 2018-10-04 DIAGNOSIS — Z9889 Other specified postprocedural states: Secondary | ICD-10-CM | POA: Diagnosis not present

## 2018-10-06 DIAGNOSIS — E7849 Other hyperlipidemia: Secondary | ICD-10-CM | POA: Diagnosis not present

## 2018-10-06 DIAGNOSIS — Z125 Encounter for screening for malignant neoplasm of prostate: Secondary | ICD-10-CM | POA: Diagnosis not present

## 2018-10-06 DIAGNOSIS — R82998 Other abnormal findings in urine: Secondary | ICD-10-CM | POA: Diagnosis not present

## 2018-10-06 DIAGNOSIS — I10 Essential (primary) hypertension: Secondary | ICD-10-CM | POA: Diagnosis not present

## 2018-10-06 DIAGNOSIS — M109 Gout, unspecified: Secondary | ICD-10-CM | POA: Diagnosis not present

## 2018-10-13 DIAGNOSIS — Z0000 Encounter for general adult medical examination without abnormal findings: Secondary | ICD-10-CM | POA: Diagnosis not present

## 2018-10-13 DIAGNOSIS — M109 Gout, unspecified: Secondary | ICD-10-CM | POA: Diagnosis not present

## 2018-10-13 DIAGNOSIS — Z6838 Body mass index (BMI) 38.0-38.9, adult: Secondary | ICD-10-CM | POA: Diagnosis not present

## 2018-10-13 DIAGNOSIS — M1990 Unspecified osteoarthritis, unspecified site: Secondary | ICD-10-CM | POA: Diagnosis not present

## 2018-10-13 DIAGNOSIS — Z952 Presence of prosthetic heart valve: Secondary | ICD-10-CM | POA: Diagnosis not present

## 2018-10-13 DIAGNOSIS — Z1331 Encounter for screening for depression: Secondary | ICD-10-CM | POA: Diagnosis not present

## 2018-10-13 DIAGNOSIS — Z23 Encounter for immunization: Secondary | ICD-10-CM | POA: Diagnosis not present

## 2018-10-13 DIAGNOSIS — N183 Chronic kidney disease, stage 3 (moderate): Secondary | ICD-10-CM | POA: Diagnosis not present

## 2018-10-13 DIAGNOSIS — G4733 Obstructive sleep apnea (adult) (pediatric): Secondary | ICD-10-CM | POA: Diagnosis not present

## 2018-10-13 DIAGNOSIS — I10 Essential (primary) hypertension: Secondary | ICD-10-CM | POA: Diagnosis not present

## 2018-10-13 DIAGNOSIS — E7849 Other hyperlipidemia: Secondary | ICD-10-CM | POA: Diagnosis not present

## 2018-10-16 ENCOUNTER — Encounter (INDEPENDENT_AMBULATORY_CARE_PROVIDER_SITE_OTHER): Payer: Self-pay | Admitting: Physician Assistant

## 2018-10-16 ENCOUNTER — Ambulatory Visit (INDEPENDENT_AMBULATORY_CARE_PROVIDER_SITE_OTHER): Payer: PPO | Admitting: Physician Assistant

## 2018-10-16 VITALS — Ht 67.0 in | Wt 254.0 lb

## 2018-10-16 DIAGNOSIS — S8002XD Contusion of left knee, subsequent encounter: Secondary | ICD-10-CM

## 2018-10-16 NOTE — Progress Notes (Signed)
Office Visit Note   Patient: Luis Boone           Date of Birth: 10-20-1950           MRN: [id number] Visit Date: 10/16/2018              Requested by: Prince Solian, MD [address] [place], [place] [address] PCP: Prince Solian, MD  Chief Complaint  Patient presents with  . Left Knee - Pain      HPI: The patient is a 68 year old gentleman who is seen for follow-up of his left knee pain.  He had a history of a fall down several stairs when trying to move a chair on 08/16/2018.  He is had landed on his left knee and elbow and presented for evaluation in early December here in the office.  He does have some patellofemoral arthritis of the left knee.  He underwent steroid injection when he was seen back in the office in early December but reported that this only lasted about 2 weeks.  He reports that overall the pain is not quite as bad but definitely noticeable just less constant.  He does report that it is keeping him awake at night.  He reports no locking or popping just pain over the medial joint line and the posterior knee.  He does not feel like the knee is going to give way.  He reports the pain is worse when he is trying to bend the knee and he cannot tolerate any pressure when he tries to kneel on the knee.  He is unable to take nonsteroidal anti-inflammatories due to some kidney disease.  Assessment & Plan: Visit Diagnoses:  1. Contusion of left knee, subsequent encounter     Plan: Due to persistent pain and failure to respond well to conservative measures we will obtain an MRI scan of the left knee to rule out internal derangement.  The patient will follow-up following the MRI scan.  Follow-Up Instructions: Return in about 3 weeks (around 11/06/2018).   Ortho Exam  Patient is alert, oriented, no adenopathy, well-dressed, normal affect, normal respiratory effort. Left knee shows a small effusion and tenderness to palpation over the medial joint line. The ecchymosis  has resolved.  He has no instability with varus valgus stress.  Negative drawers.  Range of motion is 0 to 110 degrees with pain on end range over the medial joint line. Imaging: No results found. No images are attached to the encounter.  Labs: No results found for: HGBA1C, ESRSEDRATE, CRP, LABURIC, REPTSTATUS, GRAMSTAIN, CULT, LABORGA   No results found for: ALBUMIN, PREALBUMIN, LABURIC  Body mass index is 39.78 kg/m.  Orders:  Orders Placed This Encounter  Procedures  . MR Knee Left w/o contrast   No orders of the defined types were placed in this encounter.    Procedures: No procedures performed  Clinical Data: No additional findings.  ROS:  All other systems negative, except as noted in the HPI. Review of Systems  Objective: Vital Signs: Ht 5\' 7"  (1.702 m)   Wt 254 lb (115.2 kg)   BMI 39.78 kg/m   Specialty Comments:  No specialty comments available.  PMFS History: There are no active problems to display for this patient.  Past Medical History:  Diagnosis Date  . Allergy    seasonal  . Arthritis   . Depression   . GERD (gastroesophageal reflux disease)   . Gout   . Gout   . Heart murmur  heart valve repair  . Hiatal hernia   . HLD (hyperlipidemia)   . Hypertension   . Mitral murmur   . Obesity   . Ocular herpes simplex   . PUD (peptic ulcer disease)     Family History  Problem Relation Age of Onset  . Migraines Mother   . Hypertension Mother   . Dementia Mother   . Cancer Father   . Arthritis Father   . Heart attack Father   . Colon cancer Neg Hx     Past Surgical History:  Procedure Laterality Date  . COLONOSCOPY    . KNEE ARTHROSCOPY Right 2006  . MITRAL VALVE REPAIR  2004   Social History   Occupational History  . Not on file  Tobacco Use  . Smoking status: Never Smoker  . Smokeless tobacco: Never Used  Substance and Sexual Activity  . Alcohol use: No    Alcohol/week: 0.0 standard drinks  . Drug use: No  . Sexual  activity: Not on file

## 2018-10-18 ENCOUNTER — Encounter (INDEPENDENT_AMBULATORY_CARE_PROVIDER_SITE_OTHER): Payer: Self-pay | Admitting: Physician Assistant

## 2018-10-21 HISTORY — PX: KNEE SURGERY: SHX244

## 2018-10-26 ENCOUNTER — Ambulatory Visit
Admission: RE | Admit: 2018-10-26 | Discharge: 2018-10-26 | Disposition: A | Discharge status: Home / Self Care | Payer: PPO | Source: Ambulatory Visit | Attending: Physician Assistant | Admitting: Physician Assistant

## 2018-10-26 DIAGNOSIS — S8002XD Contusion of left knee, subsequent encounter: Secondary | ICD-10-CM

## 2018-10-26 DIAGNOSIS — M25562 Pain in left knee: Secondary | ICD-10-CM | POA: Diagnosis not present

## 2018-10-30 ENCOUNTER — Other Ambulatory Visit: Payer: PPO

## 2018-11-06 ENCOUNTER — Encounter (INDEPENDENT_AMBULATORY_CARE_PROVIDER_SITE_OTHER): Payer: Self-pay | Admitting: Orthopedic Surgery

## 2018-11-06 ENCOUNTER — Ambulatory Visit (INDEPENDENT_AMBULATORY_CARE_PROVIDER_SITE_OTHER): Payer: PPO | Admitting: Physician Assistant

## 2018-11-06 VITALS — Ht 67.0 in | Wt 254.0 lb

## 2018-11-06 DIAGNOSIS — M23322 Other meniscus derangements, posterior horn of medial meniscus, left knee: Secondary | ICD-10-CM

## 2018-11-06 NOTE — Progress Notes (Signed)
Office Visit Note   Patient: Luis Boone           Date of Birth: 11-11-1950           MRN: [id number] Visit Date: 11/06/2018              Requested by: Prince Solian, MD [address] [place], [place] [address] PCP: Prince Solian, MD  Chief Complaint  Patient presents with  . Left Knee - Follow-up    MRI SCAN Review left knee      HPI: The patient is a 68 year old gentleman who is here for follow-up following his left knee MRI scan.  The MRI showed a complete radial tear through the root of the posterior horn of the medial meniscus.  The lateral meniscus was intact.  The cruciates were intact.  There was mucoid degeneration of the superior aspect of the PCL noted.  He has patellofemoral arthritis but otherwise mild degenerative changes noted elsewhere.  He reports he has continued to have pain over the left knee particularly about the medial joint line and the posterior knee.  He reports worsening of the pain when he is trying to bend the knee and he is noting pain when he tries to kneel on the knee.  Assessment & Plan: Visit Diagnoses:  1. Other meniscus derangements, posterior horn of medial meniscus, left knee     Plan: Discussed the results of the MRI scan including the medial meniscal tear with the patient.  He would like to proceed with arthroscopic debridement with the understanding that he does have degenerative changes in the knee and that we could potentially make things worse following the debridement.  He would like to proceed at this time and we will set up for surgery.  Follow-Up Instructions: Return in about 2 weeks (around 11/20/2018).   Ortho Exam  Patient is alert, oriented, no adenopathy, well-dressed, normal affect, normal respiratory effort. Left knee exam shows a small effusion and tenderness to palpation over the medial joint line.  He has no instability with varus valgus stress and negative drawer sign.  Range of motion shows full extension and about  110 degrees of flexion with pain on end range over the medial joint line.  Imaging: No results found. No images are attached to the encounter.  Labs: No results found for: HGBA1C, ESRSEDRATE, CRP, LABURIC, REPTSTATUS, GRAMSTAIN, CULT, LABORGA   No results found for: ALBUMIN, PREALBUMIN, LABURIC  Body mass index is 39.78 kg/m.  Orders:  No orders of the defined types were placed in this encounter.  No orders of the defined types were placed in this encounter.    Procedures: No procedures performed  Clinical Data: No additional findings.  ROS:  All other systems negative, except as noted in the HPI. Review of Systems  Objective: Vital Signs: Ht 5\' 7"  (1.702 m)   Wt 254 lb (115.2 kg)   BMI 39.78 kg/m   Specialty Comments:  No specialty comments available.  PMFS History: There are no active problems to display for this patient.  Past Medical History:  Diagnosis Date  . Allergy    seasonal  . Arthritis   . Depression   . GERD (gastroesophageal reflux disease)   . Gout   . Gout   . Heart murmur    heart valve repair  . Hiatal hernia   . HLD (hyperlipidemia)   . Hypertension   . Mitral murmur   . Obesity   . Ocular herpes simplex   .  PUD (peptic ulcer disease)     Family History  Problem Relation Age of Onset  . Migraines Mother   . Hypertension Mother   . Dementia Mother   . Cancer Father   . Arthritis Father   . Heart attack Father   . Colon cancer Neg Hx     Past Surgical History:  Procedure Laterality Date  . COLONOSCOPY    . KNEE ARTHROSCOPY Right 2006  . MITRAL VALVE REPAIR  2004   Social History   Occupational History  . Not on file  Tobacco Use  . Smoking status: Never Smoker  . Smokeless tobacco: Never Used  Substance and Sexual Activity  . Alcohol use: No    Alcohol/week: 0.0 standard drinks  . Drug use: No  . Sexual activity: Not on file

## 2018-11-08 ENCOUNTER — Encounter (INDEPENDENT_AMBULATORY_CARE_PROVIDER_SITE_OTHER): Payer: Self-pay | Admitting: Orthopedic Surgery

## 2018-11-14 ENCOUNTER — Other Ambulatory Visit (INDEPENDENT_AMBULATORY_CARE_PROVIDER_SITE_OTHER): Payer: Self-pay | Admitting: Orthopedic Surgery

## 2018-11-14 DIAGNOSIS — M958 Other specified acquired deformities of musculoskeletal system: Secondary | ICD-10-CM | POA: Diagnosis not present

## 2018-11-14 DIAGNOSIS — M659 Synovitis and tenosynovitis, unspecified: Secondary | ICD-10-CM | POA: Diagnosis not present

## 2018-11-14 DIAGNOSIS — G8918 Other acute postprocedural pain: Secondary | ICD-10-CM | POA: Diagnosis not present

## 2018-11-14 DIAGNOSIS — M23212 Derangement of anterior horn of medial meniscus due to old tear or injury, left knee: Secondary | ICD-10-CM | POA: Diagnosis not present

## 2018-11-14 DIAGNOSIS — M1712 Unilateral primary osteoarthritis, left knee: Secondary | ICD-10-CM | POA: Diagnosis not present

## 2018-11-14 MED ORDER — OXYCODONE-ACETAMINOPHEN 5-325 MG PO TABS: 1.0000 | ORAL_TABLET | ORAL | 0 refills | Status: DC | PRN

## 2018-11-20 DIAGNOSIS — Z6838 Body mass index (BMI) 38.0-38.9, adult: Secondary | ICD-10-CM | POA: Diagnosis not present

## 2018-11-20 DIAGNOSIS — L270 Generalized skin eruption due to drugs and medicaments taken internally: Secondary | ICD-10-CM | POA: Diagnosis not present

## 2018-11-23 ENCOUNTER — Ambulatory Visit (INDEPENDENT_AMBULATORY_CARE_PROVIDER_SITE_OTHER): Payer: PPO | Admitting: Orthopedic Surgery

## 2018-11-23 ENCOUNTER — Encounter (INDEPENDENT_AMBULATORY_CARE_PROVIDER_SITE_OTHER): Payer: Self-pay | Admitting: Orthopedic Surgery

## 2018-11-23 VITALS — Ht 67.0 in | Wt 254.0 lb

## 2018-11-23 DIAGNOSIS — M23322 Other meniscus derangements, posterior horn of medial meniscus, left knee: Secondary | ICD-10-CM

## 2018-11-27 ENCOUNTER — Encounter (INDEPENDENT_AMBULATORY_CARE_PROVIDER_SITE_OTHER): Payer: Self-pay | Admitting: Orthopedic Surgery

## 2018-11-27 DIAGNOSIS — Z6839 Body mass index (BMI) 39.0-39.9, adult: Secondary | ICD-10-CM | POA: Diagnosis not present

## 2018-11-27 DIAGNOSIS — M23322 Other meniscus derangements, posterior horn of medial meniscus, left knee: Secondary | ICD-10-CM | POA: Diagnosis not present

## 2018-11-27 DIAGNOSIS — L270 Generalized skin eruption due to drugs and medicaments taken internally: Secondary | ICD-10-CM | POA: Diagnosis not present

## 2018-11-27 MED ORDER — METHYLPREDNISOLONE ACETATE 40 MG/ML IJ SUSP
40.0000 mg | INTRAMUSCULAR | Status: AC | PRN
  Administered 2018-11-27: 40 mg via INTRA_ARTICULAR

## 2018-11-27 MED ORDER — LIDOCAINE HCL 1 % IJ SOLN
5.0000 mL | INTRAMUSCULAR | Status: AC | PRN
  Administered 2018-11-27: 5 mL

## 2018-11-27 MED ORDER — LIDOCAINE HCL 1 % IJ SOLN
1.0000 mL | INTRAMUSCULAR | Status: AC | PRN
  Administered 2018-11-27: 1 mL

## 2018-11-27 NOTE — Progress Notes (Signed)
Office Visit Note   Patient: Luis Boone           Date of Birth: 02-17-1951           MRN: [id number] Visit Date: 11/23/2018              Requested by: Prince Solian, MD [address] [place], [place] [address] PCP: Prince Solian, MD  Chief Complaint  Patient presents with  . Left Knee - Follow-up      HPI: Patient presents status post left knee arthroscopy for debridement of osteochondral defect medial femoral condyle as well as medial meniscal tear.  Patient complains of pain and swelling.  He states he developed a rash from the Percocet.  He is currently on prednisone patient states he is using ice and elevation.  Assessment & Plan: Visit Diagnoses:  1. Other meniscus derangements, posterior horn of medial meniscus, left knee     Plan: The knee was drained and injected.  Continue with current treatment as well as quad strengthening.  Follow-Up Instructions: Return in about 2 weeks (around 12/07/2018).   Ortho Exam  Patient is alert, oriented, no adenopathy, well-dressed, normal affect, normal respiratory effort. Examination patient does have an effusion of the left knee.  There is no redness no cellulitis no signs of infection.  The knee was aspirated from the superior lateral portal with 30 cc of a hemarthrosis the knee was injected.  He tolerated this well  Imaging: No results found. No images are attached to the encounter.  Labs: No results found for: HGBA1C, ESRSEDRATE, CRP, LABURIC, REPTSTATUS, GRAMSTAIN, CULT, LABORGA   No results found for: ALBUMIN, PREALBUMIN, LABURIC  Body mass index is 39.78 kg/m.  Orders:  No orders of the defined types were placed in this encounter.  No orders of the defined types were placed in this encounter.    Procedures: Large Joint Inj on 11/27/2018 10:14 AM Indications: pain and diagnostic evaluation Details: 22 G 1.5 in needle, superolateral approach  Arthrogram: No  Medications: 40 mg methylPREDNISolone  acetate 40 MG/ML; 1 mL lidocaine 1 %; 5 mL lidocaine 1 % Aspirate: 30 mL bloody Outcome: tolerated well, no immediate complications Procedure, treatment alternatives, risks and benefits explained, specific risks discussed. Consent was given by the patient. Immediately prior to procedure a time out was called to verify the correct patient, procedure, equipment, support staff and site/side marked as required. Patient was prepped and draped in the usual sterile fashion.      Clinical Data: No additional findings.  ROS:  All other systems negative, except as noted in the HPI. Review of Systems  Objective: Vital Signs: Ht 5\' 7"  (1.702 m)   Wt 254 lb (115.2 kg)   BMI 39.78 kg/m   Specialty Comments:  No specialty comments available.  PMFS History: There are no active problems to display for this patient.  Past Medical History:  Diagnosis Date  . Allergy    seasonal  . Arthritis   . Depression   . GERD (gastroesophageal reflux disease)   . Gout   . Gout   . Heart murmur    heart valve repair  . Hiatal hernia   . HLD (hyperlipidemia)   . Hypertension   . Mitral murmur   . Obesity   . Ocular herpes simplex   . PUD (peptic ulcer disease)     Family History  Problem Relation Age of Onset  . Migraines Mother   . Hypertension Mother   . Dementia Mother   .  Cancer Father   . Arthritis Father   . Heart attack Father   . Colon cancer Neg Hx     Past Surgical History:  Procedure Laterality Date  . COLONOSCOPY    . KNEE ARTHROSCOPY Right 2006  . MITRAL VALVE REPAIR  2004   Social History   Occupational History  . Not on file  Tobacco Use  . Smoking status: Never Smoker  . Smokeless tobacco: Never Used  Substance and Sexual Activity  . Alcohol use: No    Alcohol/week: 0.0 standard drinks  . Drug use: No  . Sexual activity: Not on file

## 2018-11-28 DIAGNOSIS — L738 Other specified follicular disorders: Secondary | ICD-10-CM | POA: Diagnosis not present

## 2018-12-05 ENCOUNTER — Other Ambulatory Visit: Payer: Self-pay

## 2018-12-05 ENCOUNTER — Ambulatory Visit (INDEPENDENT_AMBULATORY_CARE_PROVIDER_SITE_OTHER): Payer: PPO | Admitting: Orthopedic Surgery

## 2018-12-05 ENCOUNTER — Encounter (INDEPENDENT_AMBULATORY_CARE_PROVIDER_SITE_OTHER): Payer: Self-pay | Admitting: Orthopedic Surgery

## 2018-12-05 VITALS — Ht 67.0 in | Wt 254.0 lb

## 2018-12-05 DIAGNOSIS — Z9889 Other specified postprocedural states: Secondary | ICD-10-CM

## 2018-12-05 DIAGNOSIS — M25462 Effusion, left knee: Secondary | ICD-10-CM | POA: Insufficient documentation

## 2018-12-05 MED ORDER — METHYLPREDNISOLONE ACETATE 40 MG/ML IJ SUSP
40.0000 mg | INTRAMUSCULAR | Status: AC | PRN
  Administered 2018-12-05: 40 mg via INTRA_ARTICULAR

## 2018-12-05 MED ORDER — LIDOCAINE HCL 1 % IJ SOLN
5.0000 mL | INTRAMUSCULAR | Status: AC | PRN
  Administered 2018-12-05: 5 mL

## 2018-12-05 NOTE — Progress Notes (Signed)
Office Visit Note   Patient: Luis Boone           Date of Birth: [birth year]           MRN: [id number] Visit Date: 12/05/2018              Requested by: Prince Solian, MD [address] [place], [place] [address] PCP: Prince Solian, MD  Chief Complaint  Patient presents with  . Left Knee - Follow-up      HPI: Patient is a 68 year old gentleman who presents in follow-up status post left knee arthroscopy.  Patient states he has had recurrent swelling he is weightbearing with crutches.  Previous aspiration was 2 weeks ago.  He states he did have temporary relief.  He states then he was more active and had recurrent effusion.  Assessment & Plan: Visit Diagnoses:  1. Effusion, left knee     Plan: The knee was aspirated there was 30 cc of serosanguineous clear fluid.  Patient will increase his activities as tolerated.  Follow-Up Instructions: Return in about 3 weeks (around 12/26/2018).   Ortho Exam  Patient is alert, oriented, no adenopathy, well-dressed, normal affect, normal respiratory effort. Examination patient has recurrent effusion of the left knee there is no redness no cellulitis no signs of infection.  Imaging: No results found. No images are attached to the encounter.  Labs: No results found for: HGBA1C, ESRSEDRATE, CRP, LABURIC, REPTSTATUS, GRAMSTAIN, CULT, LABORGA   No results found for: ALBUMIN, PREALBUMIN, LABURIC  Body mass index is 39.78 kg/m.  Orders:  No orders of the defined types were placed in this encounter.  No orders of the defined types were placed in this encounter.    Procedures: Large Joint Inj: L knee on 12/05/2018 5:18 PM Indications: pain and diagnostic evaluation Details: 22 G 1.5 in needle, superolateral approach  Arthrogram: No  Medications: 5 mL lidocaine 1 %; 40 mg methylPREDNISolone acetate 40 MG/ML Aspirate: 30 mL blood-tinged Outcome: tolerated well, no immediate complications Procedure, treatment alternatives,  risks and benefits explained, specific risks discussed. Consent was given by the patient. Immediately prior to procedure a time out was called to verify the correct patient, procedure, equipment, support staff and site/side marked as required. Patient was prepped and draped in the usual sterile fashion.      Clinical Data: No additional findings.  ROS:  All other systems negative, except as noted in the HPI. Review of Systems  Objective: Vital Signs: Ht 5\' 7"  (1.702 m)   Wt 254 lb (115.2 kg)   BMI 39.78 kg/m   Specialty Comments:  No specialty comments available.  PMFS History: Patient Active Problem List   Diagnosis Date Noted  . Effusion, left knee 12/05/2018   Past Medical History:  Diagnosis Date  . Allergy    seasonal  . Arthritis   . Depression   . GERD (gastroesophageal reflux disease)   . Gout   . Gout   . Heart murmur    heart valve repair  . Hiatal hernia   . HLD (hyperlipidemia)   . Hypertension   . Mitral murmur   . Obesity   . Ocular herpes simplex   . PUD (peptic ulcer disease)     Family History  Problem Relation Age of Onset  . Migraines Mother   . Hypertension Mother   . Dementia Mother   . Cancer Father   . Arthritis Father   . Heart attack Father   . Colon cancer Neg Hx  Past Surgical History:  Procedure Laterality Date  . COLONOSCOPY    . KNEE ARTHROSCOPY Right 2006  . MITRAL VALVE REPAIR  2004   Social History   Occupational History  . Not on file  Tobacco Use  . Smoking status: Never Smoker  . Smokeless tobacco: Never Used  Substance and Sexual Activity  . Alcohol use: No    Alcohol/week: 0.0 standard drinks  . Drug use: No  . Sexual activity: Not on file

## 2018-12-12 DIAGNOSIS — G4733 Obstructive sleep apnea (adult) (pediatric): Secondary | ICD-10-CM | POA: Diagnosis not present

## 2018-12-20 ENCOUNTER — Telehealth (INDEPENDENT_AMBULATORY_CARE_PROVIDER_SITE_OTHER): Payer: Self-pay

## 2018-12-20 NOTE — Telephone Encounter (Signed)
I called the patient to get pre-screen for COVID-19 and he answered no to all questions.

## 2018-12-21 ENCOUNTER — Encounter (INDEPENDENT_AMBULATORY_CARE_PROVIDER_SITE_OTHER): Payer: Self-pay | Admitting: Orthopedic Surgery

## 2018-12-21 ENCOUNTER — Ambulatory Visit (INDEPENDENT_AMBULATORY_CARE_PROVIDER_SITE_OTHER): Payer: PPO | Admitting: Orthopedic Surgery

## 2018-12-21 ENCOUNTER — Other Ambulatory Visit: Payer: Self-pay

## 2018-12-21 VITALS — Ht 67.0 in | Wt 254.0 lb

## 2018-12-21 DIAGNOSIS — M25462 Effusion, left knee: Secondary | ICD-10-CM

## 2018-12-21 DIAGNOSIS — M23322 Other meniscus derangements, posterior horn of medial meniscus, left knee: Secondary | ICD-10-CM

## 2018-12-25 ENCOUNTER — Encounter (INDEPENDENT_AMBULATORY_CARE_PROVIDER_SITE_OTHER): Payer: Self-pay | Admitting: Orthopedic Surgery

## 2018-12-25 NOTE — Progress Notes (Signed)
Office Visit Note   Patient: Luis Boone           Date of Birth: [birth year]           MRN: [id number] Visit Date: 12/21/2018              Requested by: Prince Solian, MD [address] [place], [place] [address] PCP: Prince Solian, MD  Chief Complaint  Patient presents with  . Left Knee - Follow-up    S/p left knee aspiration 12/05/18      HPI: Patient is a 68 year old gentleman who is status post left knee arthroscopy February 25 status post aspiration of the left knee March 17 patient states he has decreased pain but he states he feels like he has a little bit of increased swelling he states he is about 50% better than before he is using Tylenol for pain.  Assessment & Plan: Visit Diagnoses:  1. Other meniscus derangements, posterior horn of medial meniscus, left knee   2. Effusion, left knee     Plan: Patient underwent aspiration with no signs of infection.  Continue with current care.  Follow-Up Instructions: Return in about 2 weeks (around 01/04/2019).   Ortho Exam  Patient is alert, oriented, no adenopathy, well-dressed, normal affect, normal respiratory effort. Examination patient has an effusion which is much less than before.  There is no redness no cellulitis no signs of infection.  Patient underwent aspiration of the left knee and there was 10 cc of clear serosanguineous fluid no signs of infection.  Imaging: No results found. No images are attached to the encounter.  Labs: No results found for: HGBA1C, ESRSEDRATE, CRP, LABURIC, REPTSTATUS, GRAMSTAIN, CULT, LABORGA   No results found for: ALBUMIN, PREALBUMIN, LABURIC  Body mass index is 39.78 kg/m.  Orders:  No orders of the defined types were placed in this encounter.  No orders of the defined types were placed in this encounter.    Procedures: No procedures performed  Clinical Data: No additional findings.  ROS:  All other systems negative, except as noted in the HPI. Review of  Systems  Objective: Vital Signs: Ht 5\' 7"  (1.702 m)   Wt 254 lb (115.2 kg)   BMI 39.78 kg/m   Specialty Comments:  No specialty comments available.  PMFS History: Patient Active Problem List   Diagnosis Date Noted  . Effusion, left knee 12/05/2018   Past Medical History:  Diagnosis Date  . Allergy    seasonal  . Arthritis   . Depression   . GERD (gastroesophageal reflux disease)   . Gout   . Gout   . Heart murmur    heart valve repair  . Hiatal hernia   . HLD (hyperlipidemia)   . Hypertension   . Mitral murmur   . Obesity   . Ocular herpes simplex   . PUD (peptic ulcer disease)     Family History  Problem Relation Age of Onset  . Migraines Mother   . Hypertension Mother   . Dementia Mother   . Cancer Father   . Arthritis Father   . Heart attack Father   . Colon cancer Neg Hx     Past Surgical History:  Procedure Laterality Date  . COLONOSCOPY    . KNEE ARTHROSCOPY Right 2006  . MITRAL VALVE REPAIR  2004   Social History   Occupational History  . Not on file  Tobacco Use  . Smoking status: Never Smoker  . Smokeless tobacco: Never Used  Substance and Sexual Activity  . Alcohol use: No    Alcohol/week: 0.0 standard drinks  . Drug use: No  . Sexual activity: Not on file

## 2019-01-18 ENCOUNTER — Other Ambulatory Visit: Payer: Self-pay

## 2019-01-18 ENCOUNTER — Ambulatory Visit (INDEPENDENT_AMBULATORY_CARE_PROVIDER_SITE_OTHER): Payer: PPO | Admitting: Physician Assistant

## 2019-01-18 ENCOUNTER — Encounter (INDEPENDENT_AMBULATORY_CARE_PROVIDER_SITE_OTHER): Payer: Self-pay | Admitting: Orthopedic Surgery

## 2019-01-18 VITALS — Ht 67.0 in | Wt 254.0 lb

## 2019-01-18 DIAGNOSIS — M23322 Other meniscus derangements, posterior horn of medial meniscus, left knee: Secondary | ICD-10-CM | POA: Diagnosis not present

## 2019-01-18 DIAGNOSIS — M25462 Effusion, left knee: Secondary | ICD-10-CM

## 2019-01-18 MED ORDER — LIDOCAINE HCL 1 % IJ SOLN
1.0000 mL | INTRAMUSCULAR | Status: AC | PRN
  Administered 2019-01-18: 09:00:00 1 mL

## 2019-01-18 NOTE — Progress Notes (Signed)
Office Visit Note   Patient: Luis Boone           Date of Birth: 1951/07/19           MRN: [id number] Visit Date: 01/18/2019              Requested by: Prince Solian, MD [address] [place], [place] [address] PCP: Prince Solian, MD  Chief Complaint  Patient presents with   Left Knee - Follow-up      HPI: The patient is a 68 yo gentleman who is seen for post operative follow up following left knee arthroscopic debridement in 10/2018/ He has developed recurrent effusion. He reports some mild pain over the left knee and feels very tight with the recurrent effusion.   Assessment & Plan: Visit Diagnoses:  1. Other meniscus derangements, posterior horn of medial meniscus, left knee   2. Effusion, left knee     Plan: after informed consent the left knee was aspirated for 45 cc for clear, serous fluid under sterile techniques and the patient tolerated this well. Continue light activities and avoid jogging or activities that pound the knee. He will follow up in 4 weeks.   Follow-Up Instructions: Return in about 2 weeks (around 02/01/2019).   Ortho Exam  Patient is alert, oriented, no adenopathy, well-dressed, normal affect, normal respiratory effort. The left knee has a moderate effusion and  was aspirated for 45 cc of clear serous yellow fluid under sterile techniques and the patient tolerated this well. He has good knee extension and flexion, but some discomfort on end flexion.   Imaging: No results found. No images are attached to the encounter.  Labs: No results found for: HGBA1C, ESRSEDRATE, CRP, LABURIC, REPTSTATUS, GRAMSTAIN, CULT, LABORGA   No results found for: ALBUMIN, PREALBUMIN, LABURIC  Body mass index is 39.78 kg/m.  Orders:  No orders of the defined types were placed in this encounter.  No orders of the defined types were placed in this encounter.    Procedures: Large Joint Inj: L knee on 01/18/2019 8:31 AM Indications: pain and diagnostic  evaluation Details: 22 G 1.5 in needle, superolateral approach  Arthrogram: No  Medications: 1 mL lidocaine 1 % Aspirate: 45 mL clear, serous and yellow Outcome: tolerated well, no immediate complications Procedure, treatment alternatives, risks and benefits explained, specific risks discussed. Consent was given by the patient. Immediately prior to procedure a time out was called to verify the correct patient, procedure, equipment, support staff and site/side marked as required. Patient was prepped and draped in the usual sterile fashion.      Clinical Data: No additional findings.  ROS:  All other systems negative, except as noted in the HPI. Review of Systems  Objective: Vital Signs: Ht 5\' 7"  (1.702 m)    Wt 254 lb (115.2 kg)    BMI 39.78 kg/m   Specialty Comments:  No specialty comments available.  PMFS History: Patient Active Problem List   Diagnosis Date Noted   Effusion, left knee 12/05/2018   Past Medical History:  Diagnosis Date   Allergy    seasonal   Arthritis    Depression    GERD (gastroesophageal reflux disease)    Gout    Gout    Heart murmur    heart valve repair   Hiatal hernia    HLD (hyperlipidemia)    Hypertension    Mitral murmur    Obesity    Ocular herpes simplex    PUD (peptic ulcer disease)  Family History  Problem Relation Age of Onset   Migraines Mother    Hypertension Mother    Dementia Mother    Cancer Father    Arthritis Father    Heart attack Father    Colon cancer Neg Hx     Past Surgical History:  Procedure Laterality Date   COLONOSCOPY     KNEE ARTHROSCOPY Right 2006   MITRAL VALVE REPAIR  2004   Social History   Occupational History   Not on file  Tobacco Use   Smoking status: Never Smoker   Smokeless tobacco: Never Used  Substance and Sexual Activity   Alcohol use: No    Alcohol/week: 0.0 standard drinks   Drug use: No   Sexual activity: Not on file

## 2019-02-01 ENCOUNTER — Ambulatory Visit (INDEPENDENT_AMBULATORY_CARE_PROVIDER_SITE_OTHER): Payer: PPO | Admitting: Orthopedic Surgery

## 2019-02-01 ENCOUNTER — Encounter: Payer: Self-pay | Admitting: Orthopedic Surgery

## 2019-02-01 ENCOUNTER — Other Ambulatory Visit: Payer: Self-pay

## 2019-02-01 VITALS — Ht 67.0 in | Wt 254.0 lb

## 2019-02-01 DIAGNOSIS — M23322 Other meniscus derangements, posterior horn of medial meniscus, left knee: Secondary | ICD-10-CM | POA: Diagnosis not present

## 2019-02-01 DIAGNOSIS — M25462 Effusion, left knee: Secondary | ICD-10-CM

## 2019-02-01 MED ORDER — METHYLPREDNISOLONE ACETATE 40 MG/ML IJ SUSP
40.0000 mg | INTRAMUSCULAR | Status: AC | PRN
  Administered 2019-02-01: 09:00:00 40 mg via INTRA_ARTICULAR

## 2019-02-01 MED ORDER — LIDOCAINE HCL 1 % IJ SOLN
5.0000 mL | INTRAMUSCULAR | Status: AC | PRN
  Administered 2019-02-01: 5 mL

## 2019-02-01 MED ORDER — LIDOCAINE HCL 1 % IJ SOLN
1.0000 mL | INTRAMUSCULAR | Status: AC | PRN
  Administered 2019-02-01: 1 mL

## 2019-02-01 NOTE — Progress Notes (Signed)
Office Visit Note   Patient: Luis Boone           Date of Birth: 1951-01-14           MRN: [id number] Visit Date: 02/01/2019              Requested by: Prince Solian, MD [address] [place], [place] [address] PCP: Prince Solian, MD  Chief Complaint  Patient presents with  . Left Knee - Routine Post Op    Left knee scope 10/2018 s/p aspiration 01/18/19      HPI: The patient is a 68 yo gentleman who is seen for post operative follow up following a left knee arthroscopic debridement about 3 months ago. He reports some recurrence of tightness and pain over the anterior knee at times. He has been taking some Tylenol at bedtime. It was aspirated a couple of weeks ago for about 45 cc of serous fluid.     Assessment & Plan: Visit Diagnoses:  1. Other meniscus derangements, posterior horn of medial meniscus, left knee   2. Effusion, left knee     Plan: Unable to aspirate much fluid from the left knee today. Injected the left knee with combination of lidocaine and Depomedrol today.  Follow up next week.   Follow-Up Instructions: Return in about 1 week (around 02/08/2019).   Ortho Exam  Patient is alert, oriented, no adenopathy, well-dressed, normal affect, normal respiratory effort. The left knee has a small effusion. There is tenderness to palpation over the medial joint and patella. He ambulates with an antalgic appearing gait. No signs of infection or cellulitis.   Imaging: No results found. No images are attached to the encounter.  Labs: No results found for: HGBA1C, ESRSEDRATE, CRP, LABURIC, REPTSTATUS, GRAMSTAIN, CULT, LABORGA   No results found for: ALBUMIN, PREALBUMIN, LABURIC  Body mass index is 39.78 kg/m.  Orders:  Orders Placed This Encounter  Procedures  . Large Joint Inj: L knee   No orders of the defined types were placed in this encounter.    Procedures: Large Joint Inj: L knee on 02/01/2019 9:06 AM Indications: pain and diagnostic  evaluation Details: 22 G 1.5 in needle, superolateral approach  Arthrogram: No  Medications: 5 mL lidocaine 1 %; 40 mg methylPREDNISolone acetate 40 MG/ML; 1 mL lidocaine 1 % Aspirate: 0 mL Outcome: tolerated well, no immediate complications Procedure, treatment alternatives, risks and benefits explained, specific risks discussed. Consent was given by the patient. Immediately prior to procedure a time out was called to verify the correct patient, procedure, equipment, support staff and site/side marked as required. Patient was prepped and draped in the usual sterile fashion.      Clinical Data: No additional findings.  ROS:  All other systems negative, except as noted in the HPI. Review of Systems  Objective: Vital Signs: Ht 5\' 7"  (1.702 m)   Wt 254 lb (115.2 kg)   BMI 39.78 kg/m   Specialty Comments:  No specialty comments available.  PMFS History: Patient Active Problem List   Diagnosis Date Noted  . Effusion, left knee 12/05/2018   Past Medical History:  Diagnosis Date  . Allergy    seasonal  . Arthritis   . Depression   . GERD (gastroesophageal reflux disease)   . Gout   . Gout   . Heart murmur    heart valve repair  . Hiatal hernia   . HLD (hyperlipidemia)   . Hypertension   . Mitral murmur   . Obesity   .  Ocular herpes simplex   . PUD (peptic ulcer disease)     Family History  Problem Relation Age of Onset  . Migraines Mother   . Hypertension Mother   . Dementia Mother   . Cancer Father   . Arthritis Father   . Heart attack Father   . Colon cancer Neg Hx     Past Surgical History:  Procedure Laterality Date  . COLONOSCOPY    . KNEE ARTHROSCOPY Right 2006  . MITRAL VALVE REPAIR  2004   Social History   Occupational History  . Not on file  Tobacco Use  . Smoking status: Never Smoker  . Smokeless tobacco: Never Used  Substance and Sexual Activity  . Alcohol use: No    Alcohol/week: 0.0 standard drinks  . Drug use: No  . Sexual  activity: Not on file

## 2019-02-08 ENCOUNTER — Encounter: Payer: Self-pay | Admitting: Orthopedic Surgery

## 2019-02-08 ENCOUNTER — Ambulatory Visit (INDEPENDENT_AMBULATORY_CARE_PROVIDER_SITE_OTHER): Payer: PPO | Admitting: Orthopedic Surgery

## 2019-02-08 ENCOUNTER — Other Ambulatory Visit: Payer: Self-pay

## 2019-02-08 VITALS — Ht 67.0 in | Wt 254.0 lb

## 2019-02-08 DIAGNOSIS — M23322 Other meniscus derangements, posterior horn of medial meniscus, left knee: Secondary | ICD-10-CM

## 2019-02-08 DIAGNOSIS — Z9889 Other specified postprocedural states: Secondary | ICD-10-CM

## 2019-02-08 NOTE — Progress Notes (Signed)
Office Visit Note   Patient: Luis Boone           Date of Birth: 10/04/1950           MRN: [id number] Visit Date: 02/08/2019              Requested by: Prince Solian, MD [address] [place], [place] [address] PCP: Prince Solian, MD  Chief Complaint  Patient presents with  . Left Knee - Routine Post Op    10/2018 left knee scope S/p inj 02/01/19      HPI: The patient is a 68 yo gentleman who is seen for post operative follow up following left knee arthroscopy 10/2018. He had some effusion post operatively and required aspiration several times and steroid injection last week.  He reports the steroid did help with the pain and there is less effusion.   Assessment & Plan: Visit Diagnoses:  1. Status post arthroscopy of left knee   2. Other meniscus derangements, posterior horn of medial meniscus, left knee     Plan: Counseled patient to work on [employer] knee strengthening program and patient was given copies of the program to work on at home.  He will follow up in 4 weeks.   Follow-Up Instructions: Return in about 4 weeks (around 03/08/2019).   Ortho Exam  Patient is alert, oriented, no adenopathy, well-dressed, normal affect, normal respiratory effort. Left knee with very small effusion. Range of motion 0-115 degrees with pain over the medial joint line with flexion. Tender to palpation over the medial joint line. No signs of infection or cellulitis. No instability.   Imaging: No results found. No images are attached to the encounter.  Labs: No results found for: HGBA1C, ESRSEDRATE, CRP, LABURIC, REPTSTATUS, GRAMSTAIN, CULT, LABORGA   No results found for: ALBUMIN, PREALBUMIN, LABURIC  Body mass index is 39.78 kg/m.  Orders:  No orders of the defined types were placed in this encounter.  No orders of the defined types were placed in this encounter.    Procedures: No procedures performed  Clinical Data: No additional findings.  ROS:  All other  systems negative, except as noted in the HPI. Review of Systems  Objective: Vital Signs: Ht 5\' 7"  (1.702 m)   Wt 254 lb (115.2 kg)   BMI 39.78 kg/m   Specialty Comments:  No specialty comments available.  PMFS History: Patient Active Problem List   Diagnosis Date Noted  . Effusion, left knee 12/05/2018   Past Medical History:  Diagnosis Date  . Allergy    seasonal  . Arthritis   . Depression   . GERD (gastroesophageal reflux disease)   . Gout   . Gout   . Heart murmur    heart valve repair  . Hiatal hernia   . HLD (hyperlipidemia)   . Hypertension   . Mitral murmur   . Obesity   . Ocular herpes simplex   . PUD (peptic ulcer disease)     Family History  Problem Relation Age of Onset  . Migraines Mother   . Hypertension Mother   . Dementia Mother   . Cancer Father   . Arthritis Father   . Heart attack Father   . Colon cancer Neg Hx     Past Surgical History:  Procedure Laterality Date  . COLONOSCOPY    . KNEE ARTHROSCOPY Right 2006  . MITRAL VALVE REPAIR  2004   Social History   Occupational History  . Not on file  Tobacco Use  .  Smoking status: Never Smoker  . Smokeless tobacco: Never Used  Substance and Sexual Activity  . Alcohol use: No    Alcohol/week: 0.0 standard drinks  . Drug use: No  . Sexual activity: Not on file

## 2019-02-16 ENCOUNTER — Encounter: Payer: Self-pay | Admitting: Internal Medicine

## 2019-02-17 ENCOUNTER — Telehealth: Payer: Self-pay

## 2019-02-17 ENCOUNTER — Other Ambulatory Visit: Payer: PPO

## 2019-02-17 DIAGNOSIS — Z20822 Contact with and (suspected) exposure to covid-19: Secondary | ICD-10-CM

## 2019-02-17 NOTE — Telephone Encounter (Signed)
Patient called and advised of a potential exposure to COVID-19 at last OV on 02/08/19, offered free testing as per scripting, patient agreeable to the testing. Appointment scheduled for today at 56 at [facility], advised location and to wear a mask for everyone in the vehicle, patient verbalized understanding. Order placed.

## 2019-02-19 DIAGNOSIS — I340 Nonrheumatic mitral (valve) insufficiency: Secondary | ICD-10-CM | POA: Insufficient documentation

## 2019-02-19 DIAGNOSIS — I10 Essential (primary) hypertension: Secondary | ICD-10-CM | POA: Insufficient documentation

## 2019-02-19 DIAGNOSIS — K279 Peptic ulcer, site unspecified, unspecified as acute or chronic, without hemorrhage or perforation: Secondary | ICD-10-CM | POA: Insufficient documentation

## 2019-02-19 LAB — NOVEL CORONAVIRUS, NAA: SARS-CoV-2, NAA: NOT DETECTED

## 2019-02-28 ENCOUNTER — Telehealth: Payer: Self-pay | Admitting: *Deleted

## 2019-02-28 ENCOUNTER — Other Ambulatory Visit: Payer: Self-pay

## 2019-02-28 ENCOUNTER — Ambulatory Visit: Payer: PPO | Admitting: *Deleted

## 2019-02-28 VITALS — Ht 67.0 in | Wt 255.0 lb

## 2019-02-28 DIAGNOSIS — Z86010 Personal history of colonic polyps: Secondary | ICD-10-CM

## 2019-02-28 MED ORDER — PEG-KCL-NACL-NASULF-NA ASC-C 140 G PO SOLR: 1.0000 | Freq: Once | ORAL | 0 refills | Status: AC

## 2019-02-28 NOTE — Telephone Encounter (Signed)
Robbin,  Thanks for the advance notice  Osvaldo Angst

## 2019-02-28 NOTE — Telephone Encounter (Signed)
Fyi.Marland KitchenMarland KitchenMarland KitchenMarland KitchenDuring pv today patient states that he had a reaction after last colonoscopy on 10/02/2014. He states he had burning during receiving propofol and then the day afterwards he developed a rash and was treated with prednisone for 5 weeks. He states after his first colonoscopy he had NO problems-2005 he was given versed and fentanyl. Patient request Versed, he does not want propofol. Thanks, Robbin pv (patient's colonoscopy is on 03/14/2019 with Dr.Perry).

## 2019-02-28 NOTE — Progress Notes (Signed)
Patient's pre-visit was done today over the phone with the patient. Name,DOB and address verified. Insurance verified. Packet of Prep instructions ON 3RD FLOOR for patient including copy of a consent form and pre-procedure patient acknowledgement form-pt is aware. Patient understands to call us back with any questions or concerns.  PLENVU SAMPLE AT 3RD FLOOR FRONT DESK PT AWARE.  Patient denies any allergies to eggs or soy. Patient denies any problems with anesthesia. PATIENT DID HAVE A REACTION AFTER LAST COLONOSCOPY=he states he had a rash for 5 weeks and was placed on prednisone. He states he had burning during propofol administration last colon. PATIENT REQUEST VERSED, HE DOES NOT WANT PROPOFOL. Patient denies any oxygen use at home. Patient denies taking any diet/weight loss medications or blood thinners. EMMI education assisgned to patient on colonoscopy, this was explained and instructions given to patient.

## 2019-03-08 ENCOUNTER — Ambulatory Visit (INDEPENDENT_AMBULATORY_CARE_PROVIDER_SITE_OTHER): Payer: PPO | Admitting: Physician Assistant

## 2019-03-08 ENCOUNTER — Other Ambulatory Visit: Payer: Self-pay

## 2019-03-08 ENCOUNTER — Encounter: Payer: Self-pay | Admitting: Orthopedic Surgery

## 2019-03-08 VITALS — Ht 67.0 in | Wt 255.0 lb

## 2019-03-08 DIAGNOSIS — M25462 Effusion, left knee: Secondary | ICD-10-CM

## 2019-03-08 DIAGNOSIS — M23322 Other meniscus derangements, posterior horn of medial meniscus, left knee: Secondary | ICD-10-CM

## 2019-03-08 DIAGNOSIS — Z9889 Other specified postprocedural states: Secondary | ICD-10-CM

## 2019-03-08 NOTE — Progress Notes (Signed)
Office Visit Note   Patient: Luis Boone           Date of Birth: [birth year]           MRN: [id number] Visit Date: 03/08/2019              Requested by: Prince Solian, MD [address] [place],  [place] [address] PCP: Prince Solian, MD  Chief Complaint  Patient presents with  . Left Knee - Follow-up    10/2018 left knee scope 02/01/19 s/p steroid injection       HPI: The patient is a 68 year old gentleman who is seen for follow-up following a left knee arthroscopic debridement in February of this year.  He continues to have recurrent effusion.  He has been working on quadriceps and VMO strengthening and reports that this is getting better.  He has minimal pain but is concerned about the residual swelling.  He is occasionally taking some Tylenol as needed for discomfort.  He last received a steroid injection to the knee on 02/01/2019.  Assessment & Plan: Visit Diagnoses:  1. Status post arthroscopy of left knee   2. Other meniscus derangements, posterior horn of medial meniscus, left knee   3. Effusion, left knee     Plan: After informed consent, Dr. Sharol Given attempted aspiration of the left knee and no fluid was obtained. The knee was then injected with a combination of lidocaine and Depo medrol.  Patient encouraged to continue quad/VMO strengthening exercises. Follow up in 4 weeks.   Follow-Up Instructions: Return in about 4 weeks (around 04/05/2019).   Ortho Exam  Patient is alert, oriented, no adenopathy, well-dressed, normal affect, normal respiratory effort. The left knee continues to have some mild residual effusion.  After informed consent the superior lateral aspect of the knee was anesthetized with lidocaine and Dr. Sharol Given did attempt to aspirate the effusion but this was unsuccessful.  The knee was then injected with a combination of lidocaine and Depo-Medrol.  The patient has improving quadriceps strength full range of motion and no instability.  There are no signs of  cellulitis or infection of the knee.  Port sites are well-healed.  Imaging: No results found. No images are attached to the encounter.  Labs: No results found for: HGBA1C, ESRSEDRATE, CRP, LABURIC, REPTSTATUS, GRAMSTAIN, CULT, LABORGA   No results found for: ALBUMIN, PREALBUMIN, LABURIC  Body mass index is 39.94 kg/m.  Orders:  Orders Placed This Encounter  Procedures  . Large Joint Inj   No orders of the defined types were placed in this encounter.    Procedures: Large Joint Inj: L knee on 03/08/2019 4:06 PM Indications: pain and diagnostic evaluation Details: 18 G 1.5 in needle, superolateral approach  Arthrogram: No  Medications: 5 mL lidocaine 1 %; 40 mg methylPREDNISolone acetate 40 MG/ML Aspirate: 0 mL Outcome: tolerated well, no immediate complications Procedure, treatment alternatives, risks and benefits explained, specific risks discussed. Consent was given by the patient. Immediately prior to procedure a time out was called to verify the correct patient, procedure, equipment, support staff and site/side marked as required. Patient was prepped and draped in the usual sterile fashion.      Clinical Data: No additional findings.  ROS:  All other systems negative, except as noted in the HPI. Review of Systems  Objective: Vital Signs: Ht 5\' 7"  (1.702 m)   Wt 255 lb (115.7 kg)   BMI 39.94 kg/m   Specialty Comments:  No specialty comments available.  PMFS History: Patient  Active Problem List   Diagnosis Date Noted  . PUD (peptic ulcer disease)   . Mitral murmur   . Hypertension   . Effusion, left knee 12/05/2018   Past Medical History:  Diagnosis Date  . Allergy    seasonal  . Arthritis   . Depression   . GERD (gastroesophageal reflux disease)   . Gout   . Gout   . Heart murmur    heart valve repair  . Hiatal hernia   . HLD (hyperlipidemia)   . Hypertension   . Mitral murmur   . Obesity   . Ocular herpes simplex   . PUD (peptic ulcer  disease)   . Sleep apnea    use CPAP    Family History  Problem Relation Age of Onset  . Migraines Mother   . Hypertension Mother   . Dementia Mother   . Cancer Father   . Arthritis Father   . Heart attack Father   . Stomach cancer Paternal Grandmother   . Colon cancer Neg Hx   . Colon polyps Neg Hx   . Esophageal cancer Neg Hx   . Rectal cancer Neg Hx     Past Surgical History:  Procedure Laterality Date  . COLONOSCOPY  2005,10/02/2014  . KNEE ARTHROSCOPY Right 2006  . MITRAL VALVE REPAIR  2004  . POLYPECTOMY     Social History   Occupational History  . Not on file  Tobacco Use  . Smoking status: Never Smoker  . Smokeless tobacco: Never Used  Substance and Sexual Activity  . Alcohol use: No    Alcohol/week: 0.0 standard drinks  . Drug use: No  . Sexual activity: Not on file

## 2019-03-09 MED ORDER — LIDOCAINE HCL 1 % IJ SOLN
5.0000 mL | INTRAMUSCULAR | Status: AC | PRN
  Administered 2019-03-08: 5 mL

## 2019-03-09 MED ORDER — METHYLPREDNISOLONE ACETATE 40 MG/ML IJ SUSP
40.0000 mg | INTRAMUSCULAR | Status: AC | PRN
  Administered 2019-03-08: 40 mg via INTRA_ARTICULAR

## 2019-03-13 ENCOUNTER — Telehealth: Payer: Self-pay | Admitting: Internal Medicine

## 2019-03-13 DIAGNOSIS — G4733 Obstructive sleep apnea (adult) (pediatric): Secondary | ICD-10-CM | POA: Diagnosis not present

## 2019-03-13 NOTE — Telephone Encounter (Signed)
Spoke w/patient regarding Covid-19 Screening Questions: ° °Do you now or have you had a fever in the last 14 days? NO °Do you have any respiratory symptoms of shortness of breath or cough now or in the last 14 days? NO °Do you have any family members or close contacts with diagnosed or suspected Covid-19 in the past 14 days? NO °Have you been tested for Covid-19 and found to be positive? NO °

## 2019-03-14 ENCOUNTER — Other Ambulatory Visit: Payer: Self-pay

## 2019-03-14 ENCOUNTER — Encounter: Payer: Self-pay | Admitting: Internal Medicine

## 2019-03-14 ENCOUNTER — Ambulatory Visit (AMBULATORY_SURGERY_CENTER): Payer: PPO | Admitting: Internal Medicine

## 2019-03-14 VITALS — BP 132/81 | HR 65 | Temp 98.5°F | Resp 15 | Ht 67.0 in | Wt 255.0 lb

## 2019-03-14 DIAGNOSIS — Z86010 Personal history of colonic polyps: Secondary | ICD-10-CM

## 2019-03-14 DIAGNOSIS — D125 Benign neoplasm of sigmoid colon: Secondary | ICD-10-CM

## 2019-03-14 DIAGNOSIS — Z1211 Encounter for screening for malignant neoplasm of colon: Secondary | ICD-10-CM | POA: Diagnosis not present

## 2019-03-14 DIAGNOSIS — D123 Benign neoplasm of transverse colon: Secondary | ICD-10-CM

## 2019-03-14 DIAGNOSIS — D124 Benign neoplasm of descending colon: Secondary | ICD-10-CM | POA: Diagnosis not present

## 2019-03-14 DIAGNOSIS — G4733 Obstructive sleep apnea (adult) (pediatric): Secondary | ICD-10-CM | POA: Diagnosis not present

## 2019-03-14 DIAGNOSIS — I10 Essential (primary) hypertension: Secondary | ICD-10-CM | POA: Diagnosis not present

## 2019-03-14 MED ORDER — SODIUM CHLORIDE 0.9 % IV SOLN: 500.0000 mL | Freq: Once | INTRAVENOUS | Status: DC

## 2019-03-14 NOTE — Progress Notes (Signed)
Luis Boone - temp [name] -  VS 

## 2019-03-14 NOTE — Op Note (Addendum)
[name] Procedure Date: 03/14/2019 9:25 AM MRN: [id number] Endoscopist: Docia Chuck. Luis Boone , MD Age: 68 Referring MD:  Date of Birth: 07-18-1951 Gender: Male Account #: [id number] Procedure:                Colonoscopy with cold snare polypectomy x 5 Indications:              High risk colon cancer surveillance: Personal                            history of multiple (3 or more) adenomas. Index                            examination 2005- for neoplasia. Subsequent                            examination January 2016 with 7 tubular adenomas. Medicines:                Monitored Anesthesia Care, Midazolam 4 mg IV,                            Fentanyl 100 micrograms IV Procedure:                Pre-Anesthesia Assessment:                           - Prior to the procedure, a History and Physical                            was performed, and patient medications and                            allergies were reviewed. The patient's tolerance of                            previous anesthesia was also reviewed. The risks                            and benefits of the procedure and the sedation                            options and risks were discussed with the patient.                            All questions were answered, and informed consent                            was obtained. Prior Anticoagulants: The patient has                            taken no previous anticoagulant or antiplatelet                            agents. ASA Grade Assessment: II - A patient with  mild systemic disease. After reviewing the risks                            and benefits, the patient was deemed in                            satisfactory condition to undergo the procedure.                           After obtaining informed consent, the colonoscope                            was passed under direct vision. Throughout the   procedure, the patient's blood pressure, pulse, and                            oxygen saturations were monitored continuously. The                            Colonoscope was introduced through the anus and                            advanced to the the cecum, identified by                            appendiceal orifice and ileocecal valve. The                            ileocecal valve, appendiceal orifice, and rectum                            were photographed. The quality of the bowel                            preparation was excellent. The colonoscopy was                            performed without difficulty. The patient tolerated                            the procedure well. The bowel preparation used was                            SUPREP via split dose instruction. Scope In: 9:42:45 AM Scope Out: 10:06:23 AM Scope Withdrawal Time: 0 hours 18 minutes 13 seconds  Total Procedure Duration: 0 hours 23 minutes 38 seconds  Findings:                 Five polyps were found in the sigmoid colon,                            descending colon and transverse colon. The polyps                            were 2 to  5 mm in size. These polyps were removed                            with a cold snare. Resection and retrieval were                            complete.                           The exam was otherwise without abnormality on                            direct and retroflexion views. Complications:            No immediate complications. Estimated blood loss:                            None. Estimated Blood Loss:     Estimated blood loss: none. Impression:               - Five 2 to 5 mm polyps in the sigmoid colon, in                            the descending colon and in the transverse colon,                            removed with a cold snare. Resected and retrieved.                           - The examination was otherwise normal on direct                            and retroflexion  views. Recommendation:           - Repeat colonoscopy in 3 years for surveillance.                           - Patient has a contact number available for                            emergencies. The signs and symptoms of potential                            delayed complications were discussed with the                            patient. Return to normal activities tomorrow.                            Written discharge instructions were provided to the                            patient.                           - Resume previous diet.                           -  Continue present medications.                           - Await pathology results. Docia Chuck. Luis Pastor, MD 03/14/2019 10:14:21 AM This report has been signed electronically.

## 2019-03-14 NOTE — Patient Instructions (Signed)
Information on polyps given to you today.  Await pathology results.  Repeat colonoscopy in 3 years.  YOU HAD AN ENDOSCOPIC PROCEDURE TODAY AT [facility]:   Refer to the procedure report that was given to you for any specific questions about what was found during the examination.  If the procedure report does not answer your questions, please call your gastroenterologist to clarify.  If you requested that your care partner not be given the details of your procedure findings, then the procedure report has been included in a sealed envelope for you to review at your convenience later.  YOU SHOULD EXPECT: Some feelings of bloating in the abdomen. Passage of more gas than usual.  Walking can help get rid of the air that was put into your GI tract during the procedure and reduce the bloating. If you had a lower endoscopy (such as a colonoscopy or flexible sigmoidoscopy) you may notice spotting of blood in your stool or on the toilet paper. If you underwent a bowel prep for your procedure, you may not have a normal bowel movement for a few days.  Please Note:  You might notice some irritation and congestion in your nose or some drainage.  This is from the oxygen used during your procedure.  There is no need for concern and it should clear up in a day or so.  SYMPTOMS TO REPORT IMMEDIATELY:   Following lower endoscopy (colonoscopy or flexible sigmoidoscopy):  Excessive amounts of blood in the stool  Significant tenderness or worsening of abdominal pains  Swelling of the abdomen that is new, acute  Fever of 100F or higher   For urgent or emergent issues, a gastroenterologist can be reached at any hour by calling [phone].   DIET:  We do recommend a small meal at first, but then you may proceed to your regular diet.  Drink plenty of fluids but you should avoid alcoholic beverages for 24 hours.  ACTIVITY:  You should plan to take it easy for the rest of today and you should  NOT DRIVE or use heavy machinery until tomorrow (because of the sedation medicines used during the test).    FOLLOW UP: Our staff will call the number listed on your records 48-72 hours following your procedure to check on you and address any questions or concerns that you may have regarding the information given to you following your procedure. If we do not reach you, we will leave a message.  We will attempt to reach you two times.  During this call, we will ask if you have developed any symptoms of COVID 19. If you develop any symptoms (ie: fever, flu-like symptoms, shortness of breath, cough etc.) before then, please call [phone].  If you test positive for Covid 19 in the 2 weeks post procedure, please call and report this information to [place].    If any biopsies were taken you will be contacted by phone or by letter within the next 1-3 weeks.  Please call us at [phone] if you have not heard about the biopsies in 3 weeks.    SIGNATURES/CONFIDENTIALITY: You and/or your care partner have signed paperwork which will be entered into your electronic medical record.  These signatures attest to the fact that that the information above on your After Visit Summary has been reviewed and is understood.  Full responsibility of the confidentiality of this discharge information lies with you and/or your care-partner.

## 2019-03-14 NOTE — Progress Notes (Signed)
Report to PACU, RN, vss, BBS= Clear.  

## 2019-03-14 NOTE — Progress Notes (Signed)
Called to room to assist during endoscopic procedure.  Patient ID and intended procedure confirmed with present staff. Received instructions for my participation in the procedure from the performing physician.  

## 2019-03-14 NOTE — Progress Notes (Signed)
Pt reported he had a reaction to PROPOFOL 10-02-2014.  Pt reported that bilateral arms burned when Propofol was administered IV.  Pt said he slept most of the afternoon and was awoke duringthe night with his cest and ack itching and with a rash neck to his waist.  Pt saw his primary md who placed him on Prednisone dose pack.  Pt said rash resolved 5 weeks later. Salome Arnt, RN reported this to [facility] Monday, Immunologist.  Pt had first colonoscopy 05-18-04 and had Fentanyl 75 mcg and versed 8 mg without any reactions. Josh Monday, CRNA said he will use Fentanyl and Versed today for sedation.  maw

## 2019-03-16 ENCOUNTER — Encounter: Payer: Self-pay | Admitting: Internal Medicine

## 2019-03-16 ENCOUNTER — Telehealth: Payer: Self-pay

## 2019-03-16 NOTE — Telephone Encounter (Signed)
  Follow up Call-  Call back number 03/14/2019  Post procedure Call Back phone  # [phone] hm  Permission to leave phone message Yes  Some recent data might be hidden     Patient questions:  Do you have a fever, pain , or abdominal swelling? No. Pain Score  0 *  Have you tolerated food without any problems? Yes.    Have you been able to return to your normal activities? Yes.    Do you have any questions about your discharge instructions: Diet   No. Medications  No. Follow up visit  No.  Do you have questions or concerns about your Care? No.  Actions: * If pain score is 4 or above: No action needed, pain <4.  1. Have you developed a fever since your procedure? no  2.   Have you had an respiratory symptoms (SOB or cough) since your procedure? no  3.   Have you tested positive for COVID 19 since your procedure no  4.   Have you had any family members/close contacts diagnosed with the COVID 19 since your procedure?  no   If yes to any of these questions please route to Joylene John, RN and Alphonsa Gin, Therapist, sports.

## 2019-03-19 DIAGNOSIS — B0052 Herpesviral keratitis: Secondary | ICD-10-CM | POA: Diagnosis not present

## 2019-03-21 DIAGNOSIS — B0052 Herpesviral keratitis: Secondary | ICD-10-CM | POA: Diagnosis not present

## 2019-03-28 DIAGNOSIS — B0052 Herpesviral keratitis: Secondary | ICD-10-CM | POA: Diagnosis not present

## 2019-04-09 ENCOUNTER — Ambulatory Visit (INDEPENDENT_AMBULATORY_CARE_PROVIDER_SITE_OTHER): Payer: PPO | Admitting: Orthopedic Surgery

## 2019-04-09 ENCOUNTER — Encounter: Payer: Self-pay | Admitting: Orthopedic Surgery

## 2019-04-09 VITALS — Ht 67.0 in | Wt 255.0 lb

## 2019-04-09 DIAGNOSIS — M23322 Other meniscus derangements, posterior horn of medial meniscus, left knee: Secondary | ICD-10-CM

## 2019-04-09 DIAGNOSIS — Z9889 Other specified postprocedural states: Secondary | ICD-10-CM

## 2019-04-09 DIAGNOSIS — M25462 Effusion, left knee: Secondary | ICD-10-CM

## 2019-04-11 DIAGNOSIS — B0052 Herpesviral keratitis: Secondary | ICD-10-CM | POA: Diagnosis not present

## 2019-04-15 ENCOUNTER — Encounter: Payer: Self-pay | Admitting: Orthopedic Surgery

## 2019-04-15 NOTE — Progress Notes (Signed)
Office Visit Note   Patient: Luis Boone           Date of Birth: 03-03-1951           MRN: [id number] Visit Date: 04/09/2019              Requested by: Prince Solian, MD [address] [place],  [place] [address] PCP: Prince Solian, MD  Chief Complaint  Patient presents with  . Left Knee - Follow-up    10/2018 left knee scope S/p aspiration and injection 03/08/19      HPI: Patient is a 68 year old gentleman who is 5 months status post left knee arthroscopy and debridement.  Patient is status post aspiration injection 4 weeks ago.  Patient states his knee feels about the same he complains of a anterior knee pain after prolonged rest better with movement he ices his knee several times a day.  Assessment & Plan: Visit Diagnoses:  1. Other meniscus derangements, posterior horn of medial meniscus, left knee   2. Effusion, left knee   3. Status post arthroscopy of left knee     Plan: Recommended close chain kinetic exercises patient states that he has had recommendations by a therapist that is a friend of his son and he will work on therapy and exercises.  Follow-Up Instructions: Return if symptoms worsen or fail to improve.   Ortho Exam  Patient is alert, oriented, no adenopathy, well-dressed, normal affect, normal respiratory effort. Examination there is minimal effusion at this time there is minimal crepitation with range of motion his knee is stable medial lateral joint line is minimally tender to palpation.  Imaging: No results found. No images are attached to the encounter.  Labs: No results found for: HGBA1C, ESRSEDRATE, CRP, LABURIC, REPTSTATUS, GRAMSTAIN, CULT, LABORGA   No results found for: ALBUMIN, PREALBUMIN, LABURIC  No results found for: MG No results found for: VD25OH  No results found for: PREALBUMIN CBC EXTENDED Latest Ref Rng & Units 01/01/2018  WBC 4.0 - 10.5 K/uL 7.0  RBC 4.22 - 5.81 MIL/uL 4.43  HGB 13.0 - 17.0 g/dL 13.5  HCT 39.0 - 52.0  % 41.2  PLT 150 - 400 K/uL 163     Body mass index is 39.94 kg/m.  Orders:  No orders of the defined types were placed in this encounter.  No orders of the defined types were placed in this encounter.    Procedures: No procedures performed  Clinical Data: No additional findings.  ROS:  All other systems negative, except as noted in the HPI. Review of Systems  Objective: Vital Signs: Ht 5\' 7"  (1.702 m)   Wt 255 lb (115.7 kg)   BMI 39.94 kg/m   Specialty Comments:  No specialty comments available.  PMFS History: Patient Active Problem List   Diagnosis Date Noted  . PUD (peptic ulcer disease)   . Mitral murmur   . Hypertension   . Effusion, left knee 12/05/2018   Past Medical History:  Diagnosis Date  . Allergy    seasonal  . Arthritis   . Depression   . GERD (gastroesophageal reflux disease)   . Gout   . Gout   . Heart murmur    heart valve repair  . Hiatal hernia   . HLD (hyperlipidemia)   . Hypertension   . Mitral murmur   . Obesity   . Ocular herpes simplex   . PUD (peptic ulcer disease)   . Sleep apnea    use CPAP  Family History  Problem Relation Age of Onset  . Migraines Mother   . Hypertension Mother   . Dementia Mother   . Cancer Father   . Arthritis Father   . Heart attack Father   . Stomach cancer Paternal Grandmother   . Colon cancer Neg Hx   . Colon polyps Neg Hx   . Esophageal cancer Neg Hx   . Rectal cancer Neg Hx     Past Surgical History:  Procedure Laterality Date  . COLONOSCOPY  2005,10/02/2014  . KNEE ARTHROSCOPY Right 2006  . MITRAL VALVE REPAIR  2004  . POLYPECTOMY     Social History   Occupational History  . Not on file  Tobacco Use  . Smoking status: Never Smoker  . Smokeless tobacco: Never Used  Substance and Sexual Activity  . Alcohol use: No    Alcohol/week: 0.0 standard drinks  . Drug use: No  . Sexual activity: Not on file

## 2019-04-23 DIAGNOSIS — J309 Allergic rhinitis, unspecified: Secondary | ICD-10-CM | POA: Diagnosis not present

## 2019-04-23 DIAGNOSIS — B0050 Herpesviral ocular disease, unspecified: Secondary | ICD-10-CM | POA: Diagnosis not present

## 2019-04-23 DIAGNOSIS — M1990 Unspecified osteoarthritis, unspecified site: Secondary | ICD-10-CM | POA: Diagnosis not present

## 2019-04-23 DIAGNOSIS — D126 Benign neoplasm of colon, unspecified: Secondary | ICD-10-CM | POA: Diagnosis not present

## 2019-04-23 DIAGNOSIS — G4733 Obstructive sleep apnea (adult) (pediatric): Secondary | ICD-10-CM | POA: Diagnosis not present

## 2019-04-23 DIAGNOSIS — N183 Chronic kidney disease, stage 3 (moderate): Secondary | ICD-10-CM | POA: Diagnosis not present

## 2019-04-23 DIAGNOSIS — I129 Hypertensive chronic kidney disease with stage 1 through stage 4 chronic kidney disease, or unspecified chronic kidney disease: Secondary | ICD-10-CM | POA: Diagnosis not present

## 2019-04-25 DIAGNOSIS — D126 Benign neoplasm of colon, unspecified: Secondary | ICD-10-CM | POA: Diagnosis not present

## 2019-04-25 DIAGNOSIS — E7849 Other hyperlipidemia: Secondary | ICD-10-CM | POA: Diagnosis not present

## 2019-04-25 DIAGNOSIS — H16212 Exposure keratoconjunctivitis, left eye: Secondary | ICD-10-CM | POA: Diagnosis not present

## 2019-04-25 DIAGNOSIS — B0052 Herpesviral keratitis: Secondary | ICD-10-CM | POA: Diagnosis not present

## 2019-05-01 ENCOUNTER — Emergency Department (HOSPITAL_COMMUNITY): Payer: PPO

## 2019-05-01 ENCOUNTER — Encounter (HOSPITAL_COMMUNITY): Payer: Self-pay | Admitting: Emergency Medicine

## 2019-05-01 ENCOUNTER — Emergency Department (HOSPITAL_COMMUNITY)
Admission: EM | Admit: 2019-05-01 | Discharge: 2019-05-01 | Disposition: A | Discharge status: Home / Self Care | Payer: PPO | Attending: Emergency Medicine | Admitting: Emergency Medicine

## 2019-05-01 ENCOUNTER — Other Ambulatory Visit: Payer: Self-pay

## 2019-05-01 DIAGNOSIS — Z20828 Contact with and (suspected) exposure to other viral communicable diseases: Secondary | ICD-10-CM | POA: Diagnosis not present

## 2019-05-01 DIAGNOSIS — K219 Gastro-esophageal reflux disease without esophagitis: Secondary | ICD-10-CM | POA: Insufficient documentation

## 2019-05-01 DIAGNOSIS — Z7982 Long term (current) use of aspirin: Secondary | ICD-10-CM | POA: Diagnosis not present

## 2019-05-01 DIAGNOSIS — I10 Essential (primary) hypertension: Secondary | ICD-10-CM | POA: Insufficient documentation

## 2019-05-01 DIAGNOSIS — Z79899 Other long term (current) drug therapy: Secondary | ICD-10-CM | POA: Diagnosis not present

## 2019-05-01 DIAGNOSIS — R079 Chest pain, unspecified: Secondary | ICD-10-CM | POA: Diagnosis not present

## 2019-05-01 DIAGNOSIS — R0789 Other chest pain: Secondary | ICD-10-CM | POA: Insufficient documentation

## 2019-05-01 LAB — BASIC METABOLIC PANEL
Anion gap: 9 (ref 5–15)
BUN: 14 mg/dL (ref 8–23)
CO2: 24 mmol/L (ref 22–32)
Calcium: 9.7 mg/dL (ref 8.9–10.3)
Chloride: 109 mmol/L (ref 98–111)
Creatinine, Ser: 1.45 mg/dL — ABNORMAL HIGH (ref 0.61–1.24)
GFR calc Af Amer: 57 mL/min — ABNORMAL LOW (ref >60)
GFR calc non Af Amer: 49 mL/min — ABNORMAL LOW (ref >60)
Glucose, Bld: 111 mg/dL — ABNORMAL HIGH (ref 70–99)
Potassium: 4.7 mmol/L (ref 3.5–5.1)
Sodium: 142 mmol/L (ref 135–145)

## 2019-05-01 LAB — SARS CORONAVIRUS 2 (TAT 6-24 HRS): SARS Coronavirus 2: NEGATIVE

## 2019-05-01 LAB — HEPATIC FUNCTION PANEL
ALT: 19 U/L (ref 0–44)
AST: 21 U/L (ref 15–41)
Albumin: 4.2 g/dL (ref 3.5–5.0)
Alkaline Phosphatase: 74 U/L (ref 38–126)
Bilirubin, Direct: 0.1 mg/dL (ref 0.0–0.2)
Indirect Bilirubin: 0.7 mg/dL (ref 0.3–0.9)
Total Bilirubin: 0.8 mg/dL (ref 0.3–1.2)
Total Protein: 7 g/dL (ref 6.5–8.1)

## 2019-05-01 LAB — LIPASE, BLOOD: Lipase: 36 U/L (ref 11–51)

## 2019-05-01 LAB — CBC
HCT: 43.1 % (ref 39.0–52.0)
Hemoglobin: 13.9 g/dL (ref 13.0–17.0)
MCH: 31.8 pg (ref 26.0–34.0)
MCHC: 32.3 g/dL (ref 30.0–36.0)
MCV: 98.6 fL (ref 80.0–100.0)
Platelets: 177 10*3/uL (ref 150–400)
RBC: 4.37 MIL/uL (ref 4.22–5.81)
RDW: 15.6 % — ABNORMAL HIGH (ref 11.5–15.5)
WBC: 7.4 10*3/uL (ref 4.0–10.5)
nRBC: 0 % (ref 0.0–0.2)

## 2019-05-01 LAB — TROPONIN I (HIGH SENSITIVITY)
Troponin I (High Sensitivity): 3 ng/L (ref <18)
Troponin I (High Sensitivity): 3 ng/L (ref <18)

## 2019-05-01 MED ORDER — NITROGLYCERIN 0.4 MG SL SUBL
0.4000 mg | SUBLINGUAL_TABLET | SUBLINGUAL | Status: DC | PRN
  Administered 2019-05-01: 14:00:00 0.4 mg via SUBLINGUAL
  Filled 2019-05-01: qty 1

## 2019-05-01 MED ORDER — SODIUM CHLORIDE 0.9% FLUSH
3.0000 mL | Freq: Once | INTRAVENOUS | Status: AC
  Administered 2019-05-01: 14:00:00 3 mL via INTRAVENOUS

## 2019-05-01 MED ORDER — LIDOCAINE VISCOUS HCL 2 % MT SOLN
15.0000 mL | Freq: Once | OROMUCOSAL | Status: AC
  Administered 2019-05-01: 16:00:00 15 mL via ORAL
  Filled 2019-05-01: qty 15

## 2019-05-01 MED ORDER — ASPIRIN 81 MG PO CHEW
324.0000 mg | CHEWABLE_TABLET | Freq: Once | ORAL | Status: AC
  Administered 2019-05-01: 324 mg via ORAL
  Filled 2019-05-01: qty 4

## 2019-05-01 MED ORDER — ALUM & MAG HYDROXIDE-SIMETH 200-200-20 MG/5ML PO SUSP
30.0000 mL | Freq: Once | ORAL | Status: AC
  Administered 2019-05-01: 16:00:00 30 mL via ORAL
  Filled 2019-05-01: qty 30

## 2019-05-01 NOTE — ED Triage Notes (Signed)
Onset one day ago chest pain continued today. States pain currently 6/10 pressure radiating to LUQ abdomina; pain.

## 2019-05-01 NOTE — ED Provider Notes (Addendum)
[facility] EMERGENCY DEPARTMENT Provider Note   CSN: [id number] Arrival date & time: 05/01/19  1203    History   Chief Complaint Chief Complaint  Patient presents with  . Chest Pain    HPI Luis Boone is a 68 y.o. male with history of hiatal hernia, GERD, hypertension, hyperlipidemia, obesity, mitral valve repair in 2004 presenting for evaluation of acute onset, intermittent chest pains since yesterday.  He reports that he experienced 2 hours of chest pain starting around 4 PM yesterday.  He describes it as a generalized chest pressure with some sharp stabbing left-sided pains radiating into the left upper quadrant.  He took some Tylenol last night but did not think this helped his chest pain very much.  He had a recurrence of similar pain around 2 hours ago that has been constant.  He denies any associated shortness of breath, nausea, vomiting, diaphoresis, lightheadedness, or syncope.  He had some improvement in his symptoms laying flat yesterday, some improvement today sitting upright.  He is a non-smoker, denies recreational drug use or alcohol intake.  He is a strong family history of heart disease and reports his father had a massive heart attack at the age of 35.  The patient's cardiologist is Dr. Einar Gip.     The history is provided by the patient.    Past Medical History:  Diagnosis Date  . Allergy    seasonal  . Arthritis   . Depression   . GERD (gastroesophageal reflux disease)   . Gout   . Gout   . Heart murmur    heart valve repair  . Hiatal hernia   . HLD (hyperlipidemia)   . Hypertension   . Mitral murmur   . Obesity   . Ocular herpes simplex   . PUD (peptic ulcer disease)   . Sleep apnea    use CPAP    Patient Active Problem List   Diagnosis Date Noted  . PUD (peptic ulcer disease)   . Mitral murmur   . Hypertension   . Effusion, left knee 12/05/2018    Past Surgical History:  Procedure Laterality Date  . COLONOSCOPY   2005,10/02/2014  . KNEE ARTHROSCOPY Right 2006  . MITRAL VALVE REPAIR  2004  . POLYPECTOMY          Home Medications    Prior to Admission medications   Medication Sig Start Date End Date Taking? Authorizing Provider  allopurinol (ZYLOPRIM) 300 MG tablet Take 300 mg by mouth daily.  02/06/19  Yes [provider]  aspirin 81 MG tablet Take 81 mg by mouth daily.   Yes [provider]  atorvastatin (LIPITOR) 40 MG tablet Take 40 mg by mouth daily.   Yes [provider]  Melatonin 10 MG CAPS Take 10 mg by mouth at bedtime.    Yes [provider]  metoprolol succinate (TOPROL-XL) 25 MG 24 hr tablet Take 25 mg by mouth daily.   Yes [provider]  Polyethyl Glycol-Propyl Glycol (SYSTANE) 0.4-0.3 % GEL ophthalmic gel Place 1 application into the left eye every evening.   Yes [provider]    Family History Family History  Problem Relation Age of Onset  . Migraines Mother   . Hypertension Mother   . Dementia Mother   . Cancer Father   . Arthritis Father   . Heart attack Father   . Stomach cancer Paternal Grandmother   . Colon cancer Neg Hx   . Colon polyps Neg  Hx   . Esophageal cancer Neg Hx   . Rectal cancer Neg Hx     Social History Social History   Tobacco Use  . Smoking status: Never Smoker  . Smokeless tobacco: Never Used  Substance Use Topics  . Alcohol use: No    Alcohol/week: 0.0 standard drinks  . Drug use: No     Allergies   Prilosec [omeprazole], Vicodin [hydrocodone-acetaminophen], Other, and Tramadol   Review of Systems Review of Systems  Constitutional: Negative for diaphoresis, fatigue and fever.  Respiratory: Negative for shortness of breath.   Cardiovascular: Positive for chest pain. Negative for leg swelling.  Gastrointestinal: Negative for abdominal distention, abdominal pain, nausea and vomiting.  All other systems reviewed and are negative.    Physical Exam Updated Vital Signs BP  114/63   Pulse 62   Temp 98.3 F (36.8 C) (Oral)   Resp 12   Ht 5' 7.5" (1.715 m)   Wt 117.9 kg   SpO2 99%   BMI 40.12 kg/m   Physical Exam Vitals signs and nursing note reviewed.  Constitutional:      General: He is not in acute distress.    Appearance: He is well-developed.  HENT:     Head: Normocephalic and atraumatic.  Eyes:     General:        Right eye: No discharge.        Left eye: No discharge.     Conjunctiva/sclera: Conjunctivae normal.  Neck:     Musculoskeletal: Normal range of motion and neck supple.     Vascular: No JVD.     Trachea: No tracheal deviation.  Cardiovascular:     Rate and Rhythm: Normal rate and regular rhythm.     Pulses:          Radial pulses are 2+ on the right side and 2+ on the left side.       Dorsalis pedis pulses are 2+ on the right side and 2+ on the left side.       Posterior tibial pulses are 2+ on the right side and 2+ on the left side.     Comments: Homans sign absent bilaterally, no lower extremity edema, no palpable cords, compartments are soft  Pulmonary:     Effort: Pulmonary effort is normal.     Breath sounds: Normal breath sounds.  Chest:     Chest wall: No deformity or tenderness.  Abdominal:     General: Bowel sounds are normal. There is no distension.     Palpations: Abdomen is soft. There is no mass.     Tenderness: There is no abdominal tenderness.  Musculoskeletal:     Right lower leg: He exhibits no tenderness. No edema.     Left lower leg: He exhibits no tenderness. No edema.  Skin:    General: Skin is warm and dry.     Findings: No erythema.  Neurological:     Mental Status: He is alert.  Psychiatric:        Behavior: Behavior normal.      ED Treatments / Results  Labs (all labs ordered are listed, but only abnormal results are displayed) Labs Reviewed  BASIC METABOLIC PANEL - Abnormal; Notable for the following components:      Result Value   Glucose, Bld 111 (*)    Creatinine, Ser 1.45 (*)     GFR calc non Af Amer 49 (*)    GFR calc Af Amer 57 (*)  All other components within normal limits  CBC - Abnormal; Notable for the following components:   RDW 15.6 (*)    All other components within normal limits  NOVEL CORONAVIRUS, NAA (HOSPITAL ORDER, SEND-OUT TO REF LAB)  HEPATIC FUNCTION PANEL  LIPASE, BLOOD  TROPONIN I (HIGH SENSITIVITY)  TROPONIN I (HIGH SENSITIVITY)    EKG EKG Interpretation  Date/Time:  Tuesday May 01 2019 12:26:34 EDT Ventricular Rate:  83 PR Interval:  120 QRS Duration: 98 QT Interval:  384 QTC Calculation: 451 R Axis:   -3 Text Interpretation:  Normal sinus rhythm with sinus arrhythmia Normal ECG Confirmed by Lennice Sites [phone]) on 05/01/2019 1:21:51 PM   Radiology Dg Chest 2 View  Result Date: 05/01/2019 CLINICAL DATA:  Chest pain for 1 day, radiating to left upper quadrant EXAM: CHEST - 2 VIEW COMPARISON:  Radiograph 01/01/2018 FINDINGS: Streaky basilar areas of opacity. Vascularity remains normally distributed. Cardiomediastinal contours are unremarkable. Stable post sternotomy changes. Coronary stent projects on lateral radiograph. No acute osseous or soft tissue abnormality. IMPRESSION: Streaky basilar opacities, favored to represent atelectasis. Electronically Signed   By: Lovena Le M.D.   On: 05/01/2019 13:59    Procedures Procedures (including critical care time)  Medications Ordered in ED Medications  nitroGLYCERIN (NITROSTAT) SL tablet 0.4 mg (0.4 mg Sublingual Given 05/01/19 1352)  alum & mag hydroxide-simeth (MAALOX/MYLANTA) 200-200-20 MG/5ML suspension 30 mL (has no administration in time range)    And  lidocaine (XYLOCAINE) 2 % viscous mouth solution 15 mL (has no administration in time range)  sodium chloride flush (NS) 0.9 % injection 3 mL (3 mLs Intravenous Given 05/01/19 1427)  aspirin chewable tablet 324 mg (324 mg Oral Given 05/01/19 1350)     Initial Impression / Assessment and Plan / ED Course  I have reviewed  the triage vital signs and the nursing notes.  Pertinent labs & imaging results that were available during my care of the patient were reviewed by me and considered in my medical decision making (see chart for details).         Luis Boone was evaluated in Emergency Department on 05/01/2019 for the symptoms described in the history of present illness. He was evaluated in the context of the global COVID-19 pandemic, which necessitated consideration that the patient might be at risk for infection with the SARS-CoV-2 virus that causes COVID-19. Institutional protocols and algorithms that pertain to the evaluation of patients at risk for COVID-19 are in a state of rapid change based on information released by regulatory bodies including the CDC and federal and state organizations. These policies and algorithms were followed during the patient's care in the ED.  Patient presenting for evaluation of intermittent chest pain since yesterday.  He is afebrile, vital signs are stable.  He is nontoxic in appearance.  Pain is not pleuritic, exertional, or reproducible in nature.  His EKG shows normal sinus rhythm with no acute ischemic abnormalities.  Chest x-ray shows streaky basilar opacities favored to represent atelectasis; no cardiomegaly or effusions.  No consolidations.  He has no shortness of breath.  Lab work reviewed by me shows no leukocytosis, no anemia, no metabolic derangements.  His creatinine is mildly elevated.  LFTs and lipase are within normal limits.  Abdomen is soft and nontender I doubt acute surgical abdominal pathology.  Doubt PE, dissection, cardiac tamponade, esophageal rupture, or pneumothorax.  Initial troponin is negative.  The patient had some improvement in his pain with administration of 1 sublingual nitroglycerin and  a full size aspirin.   3:12 PM CONSULT: Spoke with Dr. Einar Gip, the patient's cardiologist last seen in the office about 6 months ago. Patient has a HEART score of 4. Dr.  Einar Gip reviewed the patient's history and current work-up. Second troponin is currently in process.  With no EKG changes and normal initial troponin, if second troponin is within normal limits Dr. Einar Gip recommends discharge home with close outpatient follow-up in his office.  3:57 PM Serial troponins are negative.  No evidence of ACS/MI on work-up today.  Patient resting comfortably no apparent distress, reports that he is feeling much better and his symptoms have all but resolved entirely.  Tolerating p.o. without difficulty.  Patient requested COVID swab and although I have a low suspicion of COVID-19 infection, outpatient swab was ordered.  He understands that his results will result within the next 48 to 72 hours.  Discussed strict ED return precautions.  Patient and wife verbalized understanding of and agreement with plan and patient stable for discharge home at this time.  Patient was seen and evaluated by Dr. Ronnald Nian who agrees with assessment and plan at this time.  Final Clinical Impressions(s) / ED Diagnoses   Final diagnoses:  Atypical chest pain    ED Discharge Orders    None         Renita Papa, PA-C 05/01/19 1601    Lennice Sites, DO 05/01/19 1934

## 2019-05-01 NOTE — Discharge Instructions (Signed)
Your work-up today was reassuring with no signs of heart attack.  Dr. Einar Gip would like to see you in his office in the near future so give him a call tomorrow morning to schedule follow-up.  In the meantime, you can take 1 to 2 tablets of Tylenol every 6 hours as needed for pain.  You can also try acid reflux medication such as Pepcid or Protonix.  Drink plenty fluids and get plenty of rest.  Your COVID test will result in the next 48 hours.  You can review your results on my chart.  You will receive a phone call if your test is positive, you will not receive a phone call if your test is negative.  Return to the emergency department if any concerning signs or symptoms develop such as high fevers, worsening pain, shortness of breath, persistent vomiting, or loss of consciousness.

## 2019-05-01 NOTE — ED Provider Notes (Signed)
Medical screening examination/treatment/procedure(s) were conducted as a shared visit with non-physician practitioner(s) and myself.  I personally evaluated the patient during the encounter. Briefly, the patient is a 68 y.o. male with history of high cholesterol, hypertension, reflux who presents the ED with left-sided chest pain.  Symptoms on and off for the last several days.  Currently does not have any severe chest pain.  EKG shows sinus rhythm.  Troponin negative x2.  Chest x-ray with no signs of infection.  No significant anemia, electrolyte abnormality.  Creatinine at baseline.  No concern for PE given history and physical.  Wells criteria is 0.  Cardiology will follow-up outpatient.  My PA Talked with Dr. Einar Gip on the phone and given normal troponins and EKG and risk factors he states that he will follow-up outpt for further cardiac work-up. States HLD has been controlled and BP is well controlled and no hx of DM. Hemodynamically stable throughout my care and discharged from the ED in good condition.  This chart was dictated using voice recognition software.  Despite best efforts to proofread,  errors can occur which can change the documentation meaning.     EKG Interpretation  Date/Time:  Tuesday May 01 2019 12:26:34 EDT Ventricular Rate:  83 PR Interval:  120 QRS Duration: 98 QT Interval:  384 QTC Calculation: 451 R Axis:   -3 Text Interpretation:  Normal sinus rhythm with sinus arrhythmia Normal ECG Confirmed by Lennice Sites [phone]) on 05/01/2019 1:21:51 PM           Lennice Sites, DO 05/01/19 1559

## 2019-05-09 ENCOUNTER — Other Ambulatory Visit: Payer: Self-pay

## 2019-05-09 ENCOUNTER — Ambulatory Visit (INDEPENDENT_AMBULATORY_CARE_PROVIDER_SITE_OTHER): Payer: PPO | Admitting: Cardiology

## 2019-05-09 ENCOUNTER — Encounter: Payer: Self-pay | Admitting: Cardiology

## 2019-05-09 VITALS — BP 136/75 | HR 69 | Ht 67.5 in | Wt 263.0 lb

## 2019-05-09 DIAGNOSIS — Z9889 Other specified postprocedural states: Secondary | ICD-10-CM

## 2019-05-09 DIAGNOSIS — E7800 Pure hypercholesterolemia, unspecified: Secondary | ICD-10-CM | POA: Diagnosis not present

## 2019-05-09 DIAGNOSIS — I10 Essential (primary) hypertension: Secondary | ICD-10-CM

## 2019-05-09 DIAGNOSIS — Z8249 Family history of ischemic heart disease and other diseases of the circulatory system: Secondary | ICD-10-CM

## 2019-05-09 DIAGNOSIS — R0789 Other chest pain: Secondary | ICD-10-CM | POA: Diagnosis not present

## 2019-05-09 NOTE — Progress Notes (Signed)
Primary Physician:  Prince Solian, MD   Patient ID: Luis Boone, male    DOB: 01-Mar-1951, 68 y.o.   MRN: [id number]  Subjective:    Chief Complaint  Patient presents with   Hyperlipidemia    hospital follow up , left sided pain   Chest Pain    HPI: BERTHA EARWOOD  is a 68 y.o. male  with history of mitral valve repair due to flail mitral valve in 2004,hypertension, stage III chronic kidney disease, family history of premature coronary artery disease with father having had coronary disease at age 52, moderate obesity, degenerative joint disease and severe sleep apnea in Apr 2018 and follows Dr. Rexene Alberts.  Patient recently seen in the ER on 05/01/2019 with atypical chest pain. He did not have improvement in chest pain with nitroglycerin and ASA. Negative troponin and without EKG changes. He now presents for follow up.  He has continued to have chest pain for approximately 2 weeks now that is intermittent. Patient can point to where the pain is and is unrelated to activity. Does not seem to notice any alleviating or exacerbating factors; however, does notice that laying on his left side may make this worse. He denies any shortness of breath.   He was previously very active; however, has recently had issues with his left knee and has just recently been released to start back swimming. States he last swam 1 mile a few weeks ago. No exertional difficulty. Blood pressure and hyperlipidemia have been well controlled.   Past Medical History:  Diagnosis Date   Allergy    seasonal   Arthritis    Depression    GERD (gastroesophageal reflux disease)    Gout    Gout    Heart murmur    heart valve repair   Hiatal hernia    HLD (hyperlipidemia)    Hypertension    Mitral murmur    Obesity    Ocular herpes simplex    PUD (peptic ulcer disease)    Sleep apnea    use CPAP    Past Surgical History:  Procedure Laterality Date   COLONOSCOPY  2005,10/02/2014   KNEE  ARTHROSCOPY Right 2006   MITRAL VALVE REPAIR  2004   POLYPECTOMY      Social History   Socioeconomic History   Marital status: Unknown    Spouse name: Not on file   Number of children: Not on file   Years of education: Not on file   Highest education level: Not on file  Occupational History   Not on file  Social Needs   Financial resource strain: Not on file   Food insecurity    Worry: Not on file    Inability: Not on file   Transportation needs    Medical: Not on file    Non-medical: Not on file  Tobacco Use   Smoking status: Never Smoker   Smokeless tobacco: Never Used  Substance and Sexual Activity   Alcohol use: No    Alcohol/week: 0.0 standard drinks   Drug use: No   Sexual activity: Not on file  Lifestyle   Physical activity    Days per week: Not on file    Minutes per session: Not on file   Stress: Not on file  Relationships   Social connections    Talks on phone: Not on file    Gets together: Not on file    Attends religious service: Not on file    Active member of  club or organization: Not on file    Attends meetings of clubs or organizations: Not on file    Relationship status: Not on file   Intimate partner violence    Fear of current or ex partner: Not on file    Emotionally abused: Not on file    Physically abused: Not on file    Forced sexual activity: Not on file  Other Topics Concern   Not on file  Social History Narrative   Not on file    Review of Systems  Constitution: Negative for decreased appetite, malaise/fatigue, weight gain and weight loss.  Eyes: Negative for visual disturbance.  Cardiovascular: Positive for chest pain. Negative for claudication, dyspnea on exertion, leg swelling, orthopnea, palpitations and syncope.  Respiratory: Positive for snoring. Negative for hemoptysis and wheezing.   Endocrine: Negative for cold intolerance and heat intolerance.  Hematologic/Lymphatic: Does not bruise/bleed easily.    Skin: Negative for nail changes.  Musculoskeletal: Positive for joint pain and joint swelling. Negative for muscle weakness and myalgias.  Gastrointestinal: Negative for abdominal pain, change in bowel habit, nausea and vomiting.  Neurological: Positive for dizziness (since fall and head injury in Apr 2019) and headaches. Negative for difficulty with concentration and focal weakness.  Psychiatric/Behavioral: Negative for altered mental status and suicidal ideas.  All other systems reviewed and are negative.     Objective:  Blood pressure 136/75, pulse 69, height 5' 7.5" (1.715 m), weight 263 lb (119.3 kg), SpO2 96 %. Body mass index is 40.58 kg/m.    Physical Exam  Constitutional: He is oriented to person, place, and time. Vital signs are normal. He appears well-developed and well-nourished.  Morbidly obese  HENT:  Head: Normocephalic and atraumatic.  Neck: Normal range of motion.  Cardiovascular: Normal rate, regular rhythm, normal heart sounds and intact distal pulses.  Pulses:      Femoral pulses are 1+ on the right side and 1+ on the left side.      Popliteal pulses are 1+ on the right side and 1+ on the left side.       Dorsalis pedis pulses are 2+ on the right side and 2+ on the left side.  Pulmonary/Chest: Effort normal and breath sounds normal. No accessory muscle usage. No respiratory distress.  Abdominal: Soft. Bowel sounds are normal.  Musculoskeletal: Normal range of motion.  Neurological: He is alert and oriented to person, place, and time.  Skin: Skin is warm and dry.  Vitals reviewed.  Radiology: No results found.  Laboratory examination:    CMP Latest Ref Rng & Units 05/01/2019 01/01/2018  Glucose 70 - 99 mg/dL 111(H) 114(H)  BUN 8 - 23 mg/dL 14 16  Creatinine 0.61 - 1.24 mg/dL 1.45(H) 1.49(H)  Sodium 135 - 145 mmol/L 142 141  Potassium 3.5 - 5.1 mmol/L 4.7 4.1  Chloride 98 - 111 mmol/L 109 111  CO2 22 - 32 mmol/L 24 22  Calcium 8.9 - 10.3 mg/dL 9.7 9.0   Total Protein 6.5 - 8.1 g/dL 7.0 -  Total Bilirubin 0.3 - 1.2 mg/dL 0.8 -  Alkaline Phos 38 - 126 U/L 74 -  AST 15 - 41 U/L 21 -  ALT 0 - 44 U/L 19 -   CBC Latest Ref Rng & Units 05/01/2019 01/01/2018  WBC 4.0 - 10.5 K/uL 7.4 7.0  Hemoglobin 13.0 - 17.0 g/dL 13.9 13.5  Hematocrit 39.0 - 52.0 % 43.1 41.2  Platelets 150 - 400 K/uL 177 163   Lipid Panel  No  results found for: CHOL, TRIG, HDL, CHOLHDL, VLDL, LDLCALC, LDLDIRECT HEMOGLOBIN A1C No results found for: HGBA1C, MPG TSH No results for input(s): TSH in the last 8760 hours.  PRN Meds:. Medications Discontinued During This Encounter  Medication Reason   omeprazole (PRILOSEC) 20 MG capsule Entry Error   Current Meds  Medication Sig   acetaminophen (TYLENOL) 500 MG tablet Take 500 mg by mouth every 6 (six) hours as needed.   allopurinol (ZYLOPRIM) 300 MG tablet Take 300 mg by mouth daily.    aspirin 81 MG tablet Take 81 mg by mouth daily.   atorvastatin (LIPITOR) 40 MG tablet Take 40 mg by mouth daily.   famotidine (PEPCID) 40 MG tablet Take 40 mg by mouth 2 (two) times daily.   Melatonin 10 MG CAPS Take 10 mg by mouth at bedtime.    metoprolol succinate (TOPROL-XL) 25 MG 24 hr tablet Take 25 mg by mouth daily.   Polyethyl Glycol-Propyl Glycol (SYSTANE) 0.4-0.3 % GEL ophthalmic gel Place 1 application into the left eye every evening.    Cardiac Studies:   Echocardiogram [03/28/2014]:  1. Left ventricle cavity is normal in size. Normal global wall motion. Normal syst. function, calculated EF is 55%. 2. Satisfactory mitral valve repair. Echos of mitral valve repair ring are visible. Valve opening appears adequate. There is mild regurgitation. 3. Mild tricuspid regurgitation. No evidence of pulmonary hypertension.  Treadmill stress test [03/11/2014]: Indications: Screening for CAD. Conclusions: Normal Test. Negative for ischemia, The baseline ECG showed NSR,Normal ECG. During exercise there was no ST-T changes  of ischemia. The patient exercised according to the Bruce protocol, Total time recorded 5 Min. 24 sec. achieving a max heart rate of 157 which was 100% of MPHR for age and 7.3 METS of work. Symptoms: THR achieved.. Arrhythmia: Occasional PVC at rest. continue primary prevention. Baseline NIBP was 132/82. Peak NIBP was 132/82 MaxSysp was: 178 MaxDiasp was: 82.  Coronary Angiogram [2004]: no stents placed; done at [facility]:     ICD-10-CM   1. Atypical chest pain  R07.89 PCV ECHOCARDIOGRAM COMPLETE  2. History of mitral valve repair  Z98.890   3. Benign essential hypertension  I10   4. Pure hypercholesterolemia  E78.00   5. Family history of early CAD  Z50.49     ER EKG 05/02/2019: Normal sinus rhythm at rate of 58 bpm, borderline left atrial abnormality, leftward axis. Incomplete right bundle branch block. No evidence of ischemia. Compared to EKG 10/03/2017: Normal sinus rhythm at rate of 65 bpm, normal axis, no evidence of ischemia. Otherwise normal EKG.  Recommendations:   Patient seen today for hospital follow-up for atypical chest pain.  As his chest pain is not related to exertion and appears to be worsened with lying on his left side, suspect may be related to musculoskeletal etiology.  He also did not have improvement in chest pain with nitroglycerin.  I have a low suspicion for CAD.  Do not feel that he needs stress testing at this point.  I recommended heat and ice to his chest wall.  If he continues to have symptoms despite treatment for this, may consider stress testing at that point.  It has been 5 years since his last echocardiogram, will obtain this for surveillance given history of mitral valve repair.  Blood pressure and hyperlipidemia have been well controlled.  I will see him back after his echocardiogram for follow-up.  Miquel Dunn, MSN, APRN, FNP-C [facility]. [place] Office: [phone] Fax: [phone]

## 2019-05-25 ENCOUNTER — Other Ambulatory Visit: Payer: Self-pay

## 2019-05-25 ENCOUNTER — Ambulatory Visit (INDEPENDENT_AMBULATORY_CARE_PROVIDER_SITE_OTHER): Payer: PPO

## 2019-05-25 DIAGNOSIS — R0789 Other chest pain: Secondary | ICD-10-CM

## 2019-06-06 ENCOUNTER — Encounter: Payer: Self-pay | Admitting: Cardiology

## 2019-06-06 ENCOUNTER — Ambulatory Visit (INDEPENDENT_AMBULATORY_CARE_PROVIDER_SITE_OTHER): Payer: PPO | Admitting: Cardiology

## 2019-06-06 ENCOUNTER — Other Ambulatory Visit: Payer: Self-pay

## 2019-06-06 VITALS — BP 108/82 | HR 55 | Temp 97.5°F | Ht 67.5 in | Wt 259.6 lb

## 2019-06-06 DIAGNOSIS — R0789 Other chest pain: Secondary | ICD-10-CM

## 2019-06-06 DIAGNOSIS — Z9889 Other specified postprocedural states: Secondary | ICD-10-CM

## 2019-06-06 DIAGNOSIS — N183 Chronic kidney disease, stage 3 unspecified: Secondary | ICD-10-CM

## 2019-06-06 DIAGNOSIS — I10 Essential (primary) hypertension: Secondary | ICD-10-CM

## 2019-06-06 DIAGNOSIS — I129 Hypertensive chronic kidney disease with stage 1 through stage 4 chronic kidney disease, or unspecified chronic kidney disease: Secondary | ICD-10-CM | POA: Diagnosis not present

## 2019-06-06 NOTE — Progress Notes (Signed)
Primary Physician:  Prince Solian, MD   Patient ID: Luis Boone, male    DOB: 1951-03-06, 68 y.o.   MRN: [id number]  Subjective:    Chief Complaint  Patient presents with  . Chest Pain  . Results    echo  . Follow-up    4wk    HPI: Luis Boone  is a 69 y.o. male  with history of mitral valve repair due to flail mitral valve in 2004,hypertension, stage III chronic kidney disease, family history of premature coronary artery disease with father having had coronary disease at age 41, moderate obesity, degenerative joint disease and severe sleep apnea in Apr 2018 and follows Dr. Rexene Alberts.  Patient recently seen in the ER on 05/01/2019 with atypical chest pain. He did not have improvement in chest pain with nitroglycerin and ASA. Negative troponin and without EKG changes. He now presents for follow up.  He has continued to have chest pain mostly persistent. Patient can point to where the pain is and is unrelated to activity. Does not seem to notice any alleviating or exacerbating factors; however, does notice that laying on his left side may make this worse. He denies any shortness of breath. He underwent echocardiogram and now presents to discuss results.  He was previously very active; however, has recently had issues with his left knee and has just recently been released to start back swimming. States he last swam 1 mile a few weeks ago. No exertional difficulty. Blood pressure and hyperlipidemia have been well controlled.   Past Medical History:  Diagnosis Date  . Allergy    seasonal  . Arthritis   . Depression   . GERD (gastroesophageal reflux disease)   . Gout   . Gout   . Heart murmur    heart valve repair  . Hiatal hernia   . HLD (hyperlipidemia)   . Hypertension   . Mitral murmur   . Obesity   . Ocular herpes simplex   . PUD (peptic ulcer disease)   . Sleep apnea    use CPAP    Past Surgical History:  Procedure Laterality Date  . COLONOSCOPY  2005,10/02/2014   . KNEE ARTHROSCOPY Right 2006  . MITRAL VALVE REPAIR  2004  . POLYPECTOMY      Social History   Socioeconomic History  . Marital status: Married    Spouse name: Not on file  . Number of children: 2  . Years of education: Not on file  . Highest education level: Not on file  Occupational History  . Not on file  Social Needs  . Financial resource strain: Not on file  . Food insecurity    Worry: Not on file    Inability: Not on file  . Transportation needs    Medical: Not on file    Non-medical: Not on file  Tobacco Use  . Smoking status: Never Smoker  . Smokeless tobacco: Never Used  Substance and Sexual Activity  . Alcohol use: No    Alcohol/week: 0.0 standard drinks  . Drug use: No  . Sexual activity: Not on file  Lifestyle  . Physical activity    Days per week: Not on file    Minutes per session: Not on file  . Stress: Not on file  Relationships  . Social Herbalist on phone: Not on file    Gets together: Not on file    Attends religious service: Not on file    Active member  of club or organization: Not on file    Attends meetings of clubs or organizations: Not on file    Relationship status: Not on file  . Intimate partner violence    Fear of current or ex partner: Not on file    Emotionally abused: Not on file    Physically abused: Not on file    Forced sexual activity: Not on file  Other Topics Concern  . Not on file  Social History Narrative  . Not on file    Review of Systems  Constitution: Negative for decreased appetite, malaise/fatigue, weight gain and weight loss.  Eyes: Negative for visual disturbance.  Cardiovascular: Positive for chest pain. Negative for claudication, dyspnea on exertion, leg swelling, orthopnea, palpitations and syncope.  Respiratory: Positive for snoring. Negative for hemoptysis and wheezing.   Endocrine: Negative for cold intolerance and heat intolerance.  Hematologic/Lymphatic: Does not bruise/bleed easily.   Skin: Negative for nail changes.  Musculoskeletal: Positive for joint pain and joint swelling. Negative for muscle weakness and myalgias.  Gastrointestinal: Negative for abdominal pain, change in bowel habit, nausea and vomiting.  Neurological: Positive for dizziness (since fall and head injury in Apr 2019) and headaches. Negative for difficulty with concentration and focal weakness.  Psychiatric/Behavioral: Negative for altered mental status and suicidal ideas.  All other systems reviewed and are negative.     Objective:  Blood pressure 108/82, pulse (!) 55, temperature (!) 97.5 F (36.4 C), height 5' 7.5" (1.715 m), weight 259 lb 9.6 oz (117.8 kg), SpO2 96 %. Body mass index is 40.06 kg/m.    Physical Exam  Constitutional: He is oriented to person, place, and time. Vital signs are normal. He appears well-developed and well-nourished.  Morbidly obese  HENT:  Head: Normocephalic and atraumatic.  Neck: Normal range of motion.  Cardiovascular: Normal rate, regular rhythm, normal heart sounds and intact distal pulses.  Pulses:      Femoral pulses are 1+ on the right side and 1+ on the left side.      Popliteal pulses are 1+ on the right side and 1+ on the left side.       Dorsalis pedis pulses are 2+ on the right side and 2+ on the left side.  Pulmonary/Chest: Effort normal and breath sounds normal. No accessory muscle usage. No respiratory distress.  Abdominal: Soft. Bowel sounds are normal.  Musculoskeletal: Normal range of motion.  Neurological: He is alert and oriented to person, place, and time.  Skin: Skin is warm and dry.  Vitals reviewed.  Radiology: No results found.  Laboratory examination:   10/06/2018: Creatinine 1.4, eGFR 50/61, Potassium 4.4, CMP normal. CBC normal. Cholesterol 133, triglycerides 95, HDL 49, LDL 65. TSH normal. Apolipoprotein B 60  CMP Latest Ref Rng & Units 05/01/2019 01/01/2018  Glucose 70 - 99 mg/dL 111(H) 114(H)  BUN 8 - 23 mg/dL 14 16   Creatinine 0.61 - 1.24 mg/dL 1.45(H) 1.49(H)  Sodium 135 - 145 mmol/L 142 141  Potassium 3.5 - 5.1 mmol/L 4.7 4.1  Chloride 98 - 111 mmol/L 109 111  CO2 22 - 32 mmol/L 24 22  Calcium 8.9 - 10.3 mg/dL 9.7 9.0  Total Protein 6.5 - 8.1 g/dL 7.0 -  Total Bilirubin 0.3 - 1.2 mg/dL 0.8 -  Alkaline Phos 38 - 126 U/L 74 -  AST 15 - 41 U/L 21 -  ALT 0 - 44 U/L 19 -   CBC Latest Ref Rng & Units 05/01/2019 01/01/2018  WBC 4.0 - 10.5  K/uL 7.4 7.0  Hemoglobin 13.0 - 17.0 g/dL 13.9 13.5  Hematocrit 39.0 - 52.0 % 43.1 41.2  Platelets 150 - 400 K/uL 177 163   Lipid Panel  No results found for: CHOL, TRIG, HDL, CHOLHDL, VLDL, LDLCALC, LDLDIRECT HEMOGLOBIN A1C No results found for: HGBA1C, MPG TSH No results for input(s): TSH in the last 8760 hours.  PRN Meds:. There are no discontinued medications. Current Meds  Medication Sig  . acetaminophen (TYLENOL) 500 MG tablet Take 500 mg by mouth every 6 (six) hours as needed.  Marland Kitchen allopurinol (ZYLOPRIM) 300 MG tablet Take 300 mg by mouth daily.   Marland Kitchen aspirin 81 MG tablet Take 81 mg by mouth daily.  Marland Kitchen atorvastatin (LIPITOR) 40 MG tablet Take 40 mg by mouth daily.  . famotidine (PEPCID) 40 MG tablet Take 40 mg by mouth 2 (two) times daily.  . Melatonin 10 MG CAPS Take 10 mg by mouth at bedtime.   . metoprolol succinate (TOPROL-XL) 25 MG 24 hr tablet Take 25 mg by mouth daily.  Vladimir Faster Glycol-Propyl Glycol (SYSTANE) 0.4-0.3 % GEL ophthalmic gel Place 1 application into the left eye every evening.  Marland Kitchen Respiratory Therapy Supplies (CARETOUCH 2 CPAP HOSE HANGER) MISC by Does not apply route.    Cardiac Studies:   Echocardiogram 05/25/2019: Left ventricle cavity is normal in size. Normal left ventricular wall thickness. Abnormal septal wall motion due to post-operative valve. Normal LV systolic function with EF 55%. Diastolic function not assessed due to h/o mitral valve repair. Trileaflet aortic valve. Mild (Grade I) aortic regurgitation. S/p mitral  valve repair. Mild mitral stenosis. Mean PG 2 mmHg at HR 59 bpm. MVA 1.6 cm2 by pressure half-time method. Mild (Grade I) mitral regurgitation. Mild tricuspid regurgitation.  No evidence of pulmonary hypertension. Compared to previous study in 2015, mild mitral stenosis is new.   Treadmill stress test [03/11/2014]: Indications: Screening for CAD. Conclusions: Normal Test. Negative for ischemia, The baseline ECG showed NSR,Normal ECG. During exercise there was no ST-T changes of ischemia. The patient exercised according to the Bruce protocol, Total time recorded 5 Min. 24 sec. achieving a max heart rate of 157 which was 100% of MPHR for age and 7.3 METS of work. Symptoms: THR achieved.. Arrhythmia: Occasional PVC at rest. continue primary prevention. Baseline NIBP was 132/82. Peak NIBP was 132/82 MaxSysp was: 178 MaxDiasp was: 82.  Coronary Angiogram [2004]: no stents placed; done at [facility]:     ICD-10-CM   1. Atypical chest pain  R07.89 PCV MYOCARDIAL PERFUSION WITH LEXISCAN  2. Benign essential hypertension  I10   3. History of mitral valve repair  Z98.890   4. CKD (chronic kidney disease) stage 3, GFR 30-59 ml/min (HCC)  N18.3     ER EKG 05/02/2019: Normal sinus rhythm at rate of 58 bpm, borderline left atrial abnormality, leftward axis. Incomplete right bundle branch block. No evidence of ischemia. Compared to EKG 10/03/2017: Normal sinus rhythm at rate of 65 bpm, normal axis, no evidence of ischemia. Otherwise normal EKG.  Recommendations:   Patient continues to have symptoms of atypical chest pain despite treatment for musculoskeletal etiology for his chest pain.  I discussed recently obtained echocardiogram, no significant changes compared to last echo in 2015 except for mild mitral stenosis.  No etiology for his chest pain by cardiogram.  Given his significant risk factors (obesity, HTN, family history, CKD, age) and continued episodes of chest pain, will exclude  myocardial ischemia with Lexiscan nuclear stress testing.  Encouraged him to continue with heat and ice and Tylenol as needed until he is seen by me.  If stress testing is normal, will look for noncardiac etiology.  I will see him back after the stress test for follow-up, encouraged him to contact me sooner if needed.  Miquel Dunn, MSN, APRN, FNP-C [facility]. [place] Office: [phone] Fax: [phone]

## 2019-06-13 DIAGNOSIS — G4733 Obstructive sleep apnea (adult) (pediatric): Secondary | ICD-10-CM | POA: Diagnosis not present

## 2019-06-23 DIAGNOSIS — Z23 Encounter for immunization: Secondary | ICD-10-CM | POA: Diagnosis not present

## 2019-06-25 ENCOUNTER — Ambulatory Visit (INDEPENDENT_AMBULATORY_CARE_PROVIDER_SITE_OTHER): Payer: PPO

## 2019-06-25 ENCOUNTER — Other Ambulatory Visit: Payer: Self-pay

## 2019-06-25 DIAGNOSIS — R0789 Other chest pain: Secondary | ICD-10-CM

## 2019-07-03 ENCOUNTER — Ambulatory Visit (INDEPENDENT_AMBULATORY_CARE_PROVIDER_SITE_OTHER): Payer: PPO | Admitting: Cardiology

## 2019-07-03 ENCOUNTER — Encounter: Payer: Self-pay | Admitting: Cardiology

## 2019-07-03 ENCOUNTER — Other Ambulatory Visit: Payer: Self-pay

## 2019-07-03 VITALS — BP 136/61 | HR 71 | Temp 98.0°F | Ht 67.2 in | Wt 260.7 lb

## 2019-07-03 DIAGNOSIS — R0789 Other chest pain: Secondary | ICD-10-CM | POA: Diagnosis not present

## 2019-07-03 DIAGNOSIS — Z9889 Other specified postprocedural states: Secondary | ICD-10-CM | POA: Diagnosis not present

## 2019-07-03 DIAGNOSIS — I10 Essential (primary) hypertension: Secondary | ICD-10-CM

## 2019-07-03 NOTE — Progress Notes (Signed)
Primary Physician:  Prince Solian, MD   Patient ID: Luis Boone, male    DOB: November 21, 1950, 68 y.o.   MRN: [id number]  Subjective:    Chief Complaint  Patient presents with  . Chest Pain  . Follow-up    4 weeks  . Results    nuc    HPI: Luis Boone  is a 68 y.o. male  with history of mitral valve repair due to flail mitral valve in 2004,hypertension, stage III chronic kidney disease, family history of premature coronary artery disease with father having had coronary disease at age 14, moderate obesity, degenerative joint disease and severe sleep apnea in Apr 2018 and follows Dr. Rexene Alberts.  Patient recently seen in the ER on 05/01/2019 with atypical chest pain. He did not have improvement in chest pain with nitroglycerin and ASA. Negative troponin and without EKG changes. He underwent echocardiogram in Sept 2020 that showed normal LVEF. Compared to previous echo in 2015, mild mitral stenosis was new. Due to persistent symptoms, he underwent lexiscan nuclear stress test and now presents for follow up.  He reports over the last few days, he has not noticed any chest pain. He has continued to do his normal activities including even starting to do some walking. He is also swimming regularly.  Blood pressure and hyperlipidemia have been well controlled.   Past Medical History:  Diagnosis Date  . Allergy    seasonal  . Arthritis   . Depression   . GERD (gastroesophageal reflux disease)   . Gout   . Gout   . Heart murmur    heart valve repair  . Hiatal hernia   . HLD (hyperlipidemia)   . Hypertension   . Mitral murmur   . Obesity   . Ocular herpes simplex   . PUD (peptic ulcer disease)   . Sleep apnea    use CPAP    Past Surgical History:  Procedure Laterality Date  . COLONOSCOPY  2005,10/02/2014  . KNEE ARTHROSCOPY Right 2006  . MITRAL VALVE REPAIR  2004  . POLYPECTOMY      Social History   Socioeconomic History  . Marital status: Married    Spouse name: Not on  file  . Number of children: 2  . Years of education: Not on file  . Highest education level: Not on file  Occupational History  . Not on file  Social Needs  . Financial resource strain: Not on file  . Food insecurity    Worry: Not on file    Inability: Not on file  . Transportation needs    Medical: Not on file    Non-medical: Not on file  Tobacco Use  . Smoking status: Never Smoker  . Smokeless tobacco: Never Used  Substance and Sexual Activity  . Alcohol use: No    Alcohol/week: 0.0 standard drinks  . Drug use: No  . Sexual activity: Not on file  Lifestyle  . Physical activity    Days per week: Not on file    Minutes per session: Not on file  . Stress: Not on file  Relationships  . Social Herbalist on phone: Not on file    Gets together: Not on file    Attends religious service: Not on file    Active member of club or organization: Not on file    Attends meetings of clubs or organizations: Not on file    Relationship status: Not on file  . Intimate partner  violence    Fear of current or ex partner: Not on file    Emotionally abused: Not on file    Physically abused: Not on file    Forced sexual activity: Not on file  Other Topics Concern  . Not on file  Social History Narrative  . Not on file    Review of Systems  Constitution: Negative for decreased appetite, malaise/fatigue, weight gain and weight loss.  Eyes: Negative for visual disturbance.  Cardiovascular: Negative for chest pain, claudication, dyspnea on exertion, leg swelling, orthopnea, palpitations and syncope.  Respiratory: Positive for snoring. Negative for hemoptysis and wheezing.   Endocrine: Negative for cold intolerance and heat intolerance.  Hematologic/Lymphatic: Does not bruise/bleed easily.  Skin: Negative for nail changes.  Musculoskeletal: Positive for joint pain and joint swelling. Negative for muscle weakness and myalgias.  Gastrointestinal: Negative for abdominal pain, change  in bowel habit, nausea and vomiting.  Neurological: Positive for dizziness (since fall and head injury in Apr 2019) and headaches. Negative for difficulty with concentration and focal weakness.  Psychiatric/Behavioral: Negative for altered mental status and suicidal ideas.  All other systems reviewed and are negative.     Objective:  Blood pressure 136/61, pulse 71, temperature 98 F (36.7 C), height 5' 7.2" (1.707 m), weight 260 lb 11.2 oz (118.3 kg), SpO2 95 %. Body mass index is 40.59 kg/m.    Physical Exam  Constitutional: He is oriented to person, place, and time. Vital signs are normal. He appears well-developed and well-nourished.  Morbidly obese  HENT:  Head: Normocephalic and atraumatic.  Neck: Normal range of motion.  Cardiovascular: Normal rate, regular rhythm, normal heart sounds and intact distal pulses.  Pulses:      Femoral pulses are 1+ on the right side and 1+ on the left side.      Popliteal pulses are 1+ on the right side and 1+ on the left side.       Dorsalis pedis pulses are 2+ on the right side and 2+ on the left side.  Pulmonary/Chest: Effort normal and breath sounds normal. No accessory muscle usage. No respiratory distress.  Abdominal: Soft. Bowel sounds are normal.  Musculoskeletal: Normal range of motion.  Neurological: He is alert and oriented to person, place, and time.  Skin: Skin is warm and dry.  Vitals reviewed.  Radiology: No results found.  Laboratory examination:   10/06/2018: Creatinine 1.4, eGFR 50/61, Potassium 4.4, CMP normal. CBC normal. Cholesterol 133, triglycerides 95, HDL 49, LDL 65. TSH normal. Apolipoprotein B 60  CMP Latest Ref Rng & Units 05/01/2019 01/01/2018  Glucose 70 - 99 mg/dL 111(H) 114(H)  BUN 8 - 23 mg/dL 14 16  Creatinine 0.61 - 1.24 mg/dL 1.45(H) 1.49(H)  Sodium 135 - 145 mmol/L 142 141  Potassium 3.5 - 5.1 mmol/L 4.7 4.1  Chloride 98 - 111 mmol/L 109 111  CO2 22 - 32 mmol/L 24 22  Calcium 8.9 - 10.3 mg/dL 9.7 9.0   Total Protein 6.5 - 8.1 g/dL 7.0 -  Total Bilirubin 0.3 - 1.2 mg/dL 0.8 -  Alkaline Phos 38 - 126 U/L 74 -  AST 15 - 41 U/L 21 -  ALT 0 - 44 U/L 19 -   CBC Latest Ref Rng & Units 05/01/2019 01/01/2018  WBC 4.0 - 10.5 K/uL 7.4 7.0  Hemoglobin 13.0 - 17.0 g/dL 13.9 13.5  Hematocrit 39.0 - 52.0 % 43.1 41.2  Platelets 150 - 400 K/uL 177 163   Lipid Panel  No results found  for: CHOL, TRIG, HDL, CHOLHDL, VLDL, LDLCALC, LDLDIRECT HEMOGLOBIN A1C No results found for: HGBA1C, MPG TSH No results for input(s): TSH in the last 8760 hours.  PRN Meds:. There are no discontinued medications. Current Meds  Medication Sig  . acetaminophen (TYLENOL) 500 MG tablet Take 500 mg by mouth every 6 (six) hours as needed.  Marland Kitchen allopurinol (ZYLOPRIM) 300 MG tablet Take 300 mg by mouth daily.   Marland Kitchen aspirin 81 MG tablet Take 81 mg by mouth daily.  Marland Kitchen atorvastatin (LIPITOR) 40 MG tablet Take 40 mg by mouth daily.  . famotidine (PEPCID) 40 MG tablet Take 40 mg by mouth 2 (two) times daily.  . Melatonin 10 MG CAPS Take 10 mg by mouth at bedtime.   . metoprolol succinate (TOPROL-XL) 25 MG 24 hr tablet Take 25 mg by mouth daily.  Vladimir Faster Glycol-Propyl Glycol (SYSTANE) 0.4-0.3 % GEL ophthalmic gel Place 1 application into the left eye every evening.  Marland Kitchen Respiratory Therapy Supplies (CARETOUCH 2 CPAP HOSE HANGER) MISC by Does not apply route.    Cardiac Studies:   Lexiscan Myoview stress test 06/25/2019: Lexiscan stress test was performed. Stress EKG is non-diagnostic, as this is pharmacological stress test. SPECT stress and rest images demonstrate medium sized area of mildly decreased uptake in inferior myocardium, more prominent on rest images. With normal wall thickening and wall motion, this area likely represents tissue attenuation. Stress LV EF is normal 67%.  Low risk study.   Echocardiogram 05/25/2019: Left ventricle cavity is normal in size. Normal left ventricular wall thickness. Abnormal septal wall  motion due to post-operative valve. Normal LV systolic function with EF 55%. Diastolic function not assessed due to h/o mitral valve repair. Trileaflet aortic valve. Mild (Grade I) aortic regurgitation. S/p mitral valve repair. Mild mitral stenosis. Mean PG 2 mmHg at HR 59 bpm. MVA 1.6 cm2 by pressure half-time method. Mild (Grade I) mitral regurgitation. Mild tricuspid regurgitation.  No evidence of pulmonary hypertension. Compared to previous study in 2015, mild mitral stenosis is new.   Treadmill stress test [03/11/2014]: Indications: Screening for CAD. Conclusions: Normal Test. Negative for ischemia, The baseline ECG showed NSR,Normal ECG. During exercise there was no ST-T changes of ischemia. The patient exercised according to the Bruce protocol, Total time recorded 5 Min. 24 sec. achieving a max heart rate of 157 which was 100% of MPHR for age and 7.3 METS of work. Symptoms: THR achieved.. Arrhythmia: Occasional PVC at rest. continue primary prevention. Baseline NIBP was 132/82. Peak NIBP was 132/82 MaxSysp was: 178 MaxDiasp was: 82.  Coronary Angiogram [2004]: no stents placed; done at [facility]:     ICD-10-CM   1. Atypical chest pain  R07.89   2. Benign essential hypertension  I10   3. History of mitral valve repair  Z98.890     EKG 05/02/2019: Normal sinus rhythm at rate of 58 bpm, borderline left atrial abnormality, leftward axis. Incomplete right bundle branch block. No evidence of ischemia. Compared to EKG 10/03/2017: Normal sinus rhythm at rate of 65 bpm, normal axis, no evidence of ischemia. Otherwise normal EKG.  Recommendations:   I have discussed recently obtain [place] nuclear stress testing that was considered low risk study. Over the last few days patient has had improvement in chest pain.  He has been making changes to his diet and is actually started back walking and is feeling good with this.  He is on appropriate medical therapy.  States his  cholesterol is very well controlled.  As his symptoms have improved, will continue with watchful waiting.  Should he have recurrence of symptoms, could potentially try medical therapy and evaluation for noncardiac etiology as his echocardiogram and stress testing have been unyielding.  I will plan to see him back in 3 months for follow-up after he sees his PCP to evaluate his labs and update our records.  Miquel Dunn, MSN, APRN, FNP-C [facility]. [place] Office: [phone] Fax: [phone]

## 2019-08-07 ENCOUNTER — Encounter: Payer: Self-pay | Admitting: Neurology

## 2019-08-09 ENCOUNTER — Other Ambulatory Visit: Payer: Self-pay

## 2019-08-09 ENCOUNTER — Ambulatory Visit: Payer: PPO | Admitting: Adult Health

## 2019-08-09 ENCOUNTER — Encounter: Payer: Self-pay | Admitting: Adult Health

## 2019-08-09 VITALS — BP 116/70 | HR 72 | Temp 97.6°F | Ht 67.5 in | Wt 255.2 lb

## 2019-08-09 DIAGNOSIS — G4733 Obstructive sleep apnea (adult) (pediatric): Secondary | ICD-10-CM | POA: Diagnosis not present

## 2019-08-09 DIAGNOSIS — B0052 Herpesviral keratitis: Secondary | ICD-10-CM | POA: Diagnosis not present

## 2019-08-09 DIAGNOSIS — Z9989 Dependence on other enabling machines and devices: Secondary | ICD-10-CM | POA: Diagnosis not present

## 2019-08-09 NOTE — Patient Instructions (Signed)
Continue using CPAP nightly and greater than 4 hours each night °If your symptoms worsen or you develop new symptoms please let us know.  ° °

## 2019-08-09 NOTE — Progress Notes (Addendum)
PATIENT: Luis Boone DOB: 1951/01/12  REASON FOR VISIT: follow up HISTORY FROM: patient  HISTORY OF PRESENT ILLNESS: Today 08/09/19:  [name] is a 68 year old male with a history of obstructive sleep apnea on CPAP.  His download indicates that he used his machine 30 out of 30 days for compliance of 100%.  He uses machine greater than 4 hours 28 days for compliance of 93%.  On average he uses his machine 7 hours and 36 minutes.  His residual AHI is 3.4 on 15 cm of water with EPR of 3.  Leak in the 95th percentile is 9.2 L/min.  He reports that the CPAP is working well for him.  He denies any new issues.  He returns today for an evaluation.  HISTORY 08/07/2018: I reviewed his CPAP compliance data from 07/04/2018 through 08/02/2018, which is a total of 30 days, during which time he used his CPAP every night with percent used days greater than 4 hours at 97%, indicating excellent compliance. Average usage of 7 hours and 16 minutes, residual AHI at goal at 2.3 per hour, leak acceptable with the 95th percentile at 15.6 L/m on a pressure of 15 cm with EPR of 3. He reports doing okay with the CPAP. He has had some difficulty sleeping, not so much falling asleep, but wakes up and is up for over an hour at times.  No significant nocturia. No PLMs, no RLS type Sx. Does have more stress, what with 45 yo mom now in memory care. Did have a recent fall a couple of months ago, tripped and fell forward, bruised L knee. He is the second oldest of 5 total siblings, 4 of them local and visit mom regularly.   REVIEW OF SYSTEMS: Out of a complete 14 system review of symptoms, the patient complains only of the following symptoms, and all other reviewed systems are negative.  ESS10   ALLERGIES: Allergies  Allergen Reactions  . Prilosec [Omeprazole] Rash  . Vicodin [Hydrocodone-Acetaminophen] Rash  . Other Rash    Medication to sedate him for a colonoscopy. 10-02-2014 propofol  . Tramadol Rash    HOME  MEDICATIONS: Outpatient Medications Prior to Visit  Medication Sig Dispense Refill  . acetaminophen (TYLENOL) 500 MG tablet Take 500 mg by mouth every 6 (six) hours as needed.    Marland Kitchen allopurinol (ZYLOPRIM) 300 MG tablet Take 300 mg by mouth daily.     Marland Kitchen aspirin 81 MG tablet Take 81 mg by mouth daily.    Marland Kitchen atorvastatin (LIPITOR) 40 MG tablet Take 40 mg by mouth daily.    . famotidine (PEPCID) 40 MG tablet Take 40 mg by mouth 2 (two) times daily.    . Melatonin 10 MG CAPS Take 10 mg by mouth at bedtime.     . metoprolol succinate (TOPROL-XL) 25 MG 24 hr tablet Take 25 mg by mouth daily.    Vladimir Faster Glycol-Propyl Glycol (SYSTANE) 0.4-0.3 % GEL ophthalmic gel Place 1 application into the left eye every evening.    Marland Kitchen Respiratory Therapy Supplies (CARETOUCH 2 CPAP HOSE HANGER) MISC by Does not apply route.     No facility-administered medications prior to visit.     PAST MEDICAL HISTORY: Past Medical History:  Diagnosis Date  . Allergy    seasonal  . Arthritis   . Depression   . GERD (gastroesophageal reflux disease)   . Gout   . Heart murmur    heart valve repair  . Hiatal hernia   .  HLD (hyperlipidemia)   . Hypertension   . Mitral murmur   . Obesity   . Ocular herpes simplex   . PUD (peptic ulcer disease)   . Sleep apnea    use CPAP    PAST SURGICAL HISTORY: Past Surgical History:  Procedure Laterality Date  . COLONOSCOPY  2005,10/02/2014  . KNEE ARTHROSCOPY Right 2006  . KNEE SURGERY  10/2018   meniscus repair  . MITRAL VALVE REPAIR  2004  . POLYPECTOMY      FAMILY HISTORY: Family History  Problem Relation Age of Onset  . Migraines Mother   . Hypertension Mother   . Dementia Mother   . Cancer Father   . Arthritis Father   . Heart attack Father   . Stomach cancer Paternal Grandmother   . Colon cancer Neg Hx   . Colon polyps Neg Hx   . Esophageal cancer Neg Hx   . Rectal cancer Neg Hx     SOCIAL HISTORY: Social History   Socioeconomic History  .  Marital status: Married    Spouse name: Not on file  . Number of children: 2  . Years of education: Not on file  . Highest education level: Not on file  Occupational History  . Not on file  Social Needs  . Financial resource strain: Not on file  . Food insecurity    Worry: Not on file    Inability: Not on file  . Transportation needs    Medical: Not on file    Non-medical: Not on file  Tobacco Use  . Smoking status: Never Smoker  . Smokeless tobacco: Never Used  Substance and Sexual Activity  . Alcohol use: No    Alcohol/week: 0.0 standard drinks  . Drug use: No  . Sexual activity: Not on file  Lifestyle  . Physical activity    Days per week: Not on file    Minutes per session: Not on file  . Stress: Not on file  Relationships  . Social Herbalist on phone: Not on file    Gets together: Not on file    Attends religious service: Not on file    Active member of club or organization: Not on file    Attends meetings of clubs or organizations: Not on file    Relationship status: Not on file  . Intimate partner violence    Fear of current or ex partner: Not on file    Emotionally abused: Not on file    Physically abused: Not on file    Forced sexual activity: Not on file  Other Topics Concern  . Not on file  Social History Narrative  . Not on file      PHYSICAL EXAM  Vitals:   08/09/19 1302  BP: 116/70  Pulse: 72  Temp: 97.6 F (36.4 C)  Weight: 255 lb 3.2 oz (115.8 kg)  Height: 5' 7.5" (1.715 m)   Body mass index is 39.38 kg/m.  Generalized: Well developed, in no acute distress  Chest: Lungs clear to auscultation bilaterally  Neurological examination  Mentation: Alert oriented to time, place, history taking. Follows all commands speech and language fluent Cranial nerve II-XII: Extraocular movements were full, visual field were full on confrontational test Head turning and shoulder shrug  were normal and symmetric. Motor: The motor testing  reveals 5 over 5 strength of all 4 extremities. Good symmetric motor tone is noted throughout.  Sensory: Sensory testing is intact to soft touch on all  4 extremities. No evidence of extinction is noted.  Gait and station: Gait is normal.   DIAGNOSTIC DATA (LABS, IMAGING, TESTING) - I reviewed patient records, labs, notes, testing and imaging myself where available.  Lab Results  Component Value Date   WBC 7.4 05/01/2019   HGB 13.9 05/01/2019   HCT 43.1 05/01/2019   MCV 98.6 05/01/2019   PLT 177 05/01/2019      Component Value Date/Time   NA 142 05/01/2019 1246   K 4.7 05/01/2019 1246   CL 109 05/01/2019 1246   CO2 24 05/01/2019 1246   GLUCOSE 111 (H) 05/01/2019 1246   BUN 14 05/01/2019 1246   CREATININE 1.45 (H) 05/01/2019 1246   CALCIUM 9.7 05/01/2019 1246   PROT 7.0 05/01/2019 1323   ALBUMIN 4.2 05/01/2019 1323   AST 21 05/01/2019 1323   ALT 19 05/01/2019 1323   ALKPHOS 74 05/01/2019 1323   BILITOT 0.8 05/01/2019 1323   GFRNONAA 49 (L) 05/01/2019 1246   GFRAA 57 (L) 05/01/2019 1246      ASSESSMENT AND PLAN 68 y.o. year old male  has a past medical history of Allergy, Arthritis, Depression, GERD (gastroesophageal reflux disease), Gout, Heart murmur, Hiatal hernia, HLD (hyperlipidemia), Hypertension, Mitral murmur, Obesity, Ocular herpes simplex, PUD (peptic ulcer disease), and Sleep apnea. here with :  1. Obstructive sleep apnea on CPAP  The patient's CPAP download shows excellent compliance and good treatment of his apnea.  He is encouraged to continue using CPAP nightly and greater than 4 hours each night.  He is advised that if his symptoms worsen or he develops new symptoms he should let us know.  He will follow-up in 1 year or sooner if needed    I spent 15 minutes with the patient. 50% of this time was spent reviewing CPAP download   Ward Givens, MSN, NP-C 08/09/2019, 1:19 PM [facility] [address] [place], [place] [address]  [phone]  I reviewed the above note and documentation by the Nurse Practitioner and agree with the history, exam, assessment and plan as outlined above. I was available for consultation. Star Age, MD, PhD [facility])

## 2019-08-15 DIAGNOSIS — B0052 Herpesviral keratitis: Secondary | ICD-10-CM | POA: Diagnosis not present

## 2019-09-15 DIAGNOSIS — G4733 Obstructive sleep apnea (adult) (pediatric): Secondary | ICD-10-CM | POA: Diagnosis not present

## 2019-09-19 DIAGNOSIS — B0052 Herpesviral keratitis: Secondary | ICD-10-CM | POA: Diagnosis not present

## 2019-10-16 DIAGNOSIS — E7849 Other hyperlipidemia: Secondary | ICD-10-CM | POA: Diagnosis not present

## 2019-10-16 DIAGNOSIS — Z125 Encounter for screening for malignant neoplasm of prostate: Secondary | ICD-10-CM | POA: Diagnosis not present

## 2019-10-16 DIAGNOSIS — R82998 Other abnormal findings in urine: Secondary | ICD-10-CM | POA: Diagnosis not present

## 2019-10-16 DIAGNOSIS — M109 Gout, unspecified: Secondary | ICD-10-CM | POA: Diagnosis not present

## 2019-10-16 DIAGNOSIS — I10 Essential (primary) hypertension: Secondary | ICD-10-CM | POA: Diagnosis not present

## 2019-10-19 ENCOUNTER — Ambulatory Visit: Payer: PPO | Admitting: Cardiology

## 2019-10-23 DIAGNOSIS — N1831 Chronic kidney disease, stage 3a: Secondary | ICD-10-CM | POA: Diagnosis not present

## 2019-10-23 DIAGNOSIS — J309 Allergic rhinitis, unspecified: Secondary | ICD-10-CM | POA: Diagnosis not present

## 2019-10-23 DIAGNOSIS — I129 Hypertensive chronic kidney disease with stage 1 through stage 4 chronic kidney disease, or unspecified chronic kidney disease: Secondary | ICD-10-CM | POA: Diagnosis not present

## 2019-10-23 DIAGNOSIS — M1990 Unspecified osteoarthritis, unspecified site: Secondary | ICD-10-CM | POA: Diagnosis not present

## 2019-10-23 DIAGNOSIS — Z1331 Encounter for screening for depression: Secondary | ICD-10-CM | POA: Diagnosis not present

## 2019-10-23 DIAGNOSIS — E785 Hyperlipidemia, unspecified: Secondary | ICD-10-CM | POA: Diagnosis not present

## 2019-10-23 DIAGNOSIS — Z952 Presence of prosthetic heart valve: Secondary | ICD-10-CM | POA: Diagnosis not present

## 2019-10-23 DIAGNOSIS — M109 Gout, unspecified: Secondary | ICD-10-CM | POA: Diagnosis not present

## 2019-10-23 DIAGNOSIS — D126 Benign neoplasm of colon, unspecified: Secondary | ICD-10-CM | POA: Diagnosis not present

## 2019-10-23 DIAGNOSIS — Z0000 Encounter for general adult medical examination without abnormal findings: Secondary | ICD-10-CM | POA: Diagnosis not present

## 2019-10-23 DIAGNOSIS — G4733 Obstructive sleep apnea (adult) (pediatric): Secondary | ICD-10-CM | POA: Diagnosis not present

## 2019-11-02 ENCOUNTER — Other Ambulatory Visit: Payer: Self-pay

## 2019-11-02 ENCOUNTER — Ambulatory Visit: Payer: PPO | Admitting: Cardiology

## 2019-11-02 ENCOUNTER — Encounter: Payer: Self-pay | Admitting: Cardiology

## 2019-11-02 VITALS — BP 129/75 | HR 60 | Temp 98.0°F | Resp 14 | Ht 69.0 in | Wt 252.0 lb

## 2019-11-02 DIAGNOSIS — E7800 Pure hypercholesterolemia, unspecified: Secondary | ICD-10-CM | POA: Diagnosis not present

## 2019-11-02 DIAGNOSIS — Z9889 Other specified postprocedural states: Secondary | ICD-10-CM

## 2019-11-02 DIAGNOSIS — I10 Essential (primary) hypertension: Secondary | ICD-10-CM | POA: Diagnosis not present

## 2019-11-02 DIAGNOSIS — R0789 Other chest pain: Secondary | ICD-10-CM

## 2019-11-02 DIAGNOSIS — I491 Atrial premature depolarization: Secondary | ICD-10-CM

## 2019-11-02 NOTE — Progress Notes (Signed)
Primary Physician:  Prince Solian, MD   Patient ID: Luis Boone, male    DOB: 11-May-1951, 69 y.o.   MRN: [id number]  Subjective:    Chief Complaint  Patient presents with  . Chest Pain  . Follow-up    HPI: Luis Boone  is a 69 y.o. male  with history of mitral valve repair due to flail mitral valve in 2004,hypertension, stage III chronic kidney disease, family history of premature coronary artery disease with father having had coronary disease at age 56, moderate obesity, degenerative joint disease and severe sleep apnea in Apr 2018 and follows Dr. Rexene Alberts.  He is here for 3 month office visit for atypical chest pain over the last several months. He underwent echocardiogram in Sept 2020 that showed normal LVEF. Compared to previous echo in 2015, mild mitral stenosis was new. He has also had lexiscan nuclear stress test in Oct 2020 that was considered low risk study. At his last office visit, he had resolution in chest pain. He reports over the last approximate 2 months, he has again had intermittent episodes of chest pain, that he notices with walking up stairs or just at rest, but does not happen with walking when he is able to exercise. He has not been able to exercise as much lately. Reports losing two family members to [place] over the last few months.   Blood pressure and hyperlipidemia have been well controlled.   Past Medical History:  Diagnosis Date  . Allergy    seasonal  . Arthritis   . Depression   . GERD (gastroesophageal reflux disease)   . Gout   . Heart murmur    heart valve repair  . Hiatal hernia   . HLD (hyperlipidemia)   . Hypertension   . Mitral murmur   . Obesity   . Ocular herpes simplex   . PUD (peptic ulcer disease)   . Sleep apnea    use CPAP    Past Surgical History:  Procedure Laterality Date  . COLONOSCOPY  2005,10/02/2014  . KNEE ARTHROSCOPY Right 2006  . KNEE SURGERY  10/2018   meniscus repair  . MITRAL VALVE REPAIR  2004  .  POLYPECTOMY      Social History   Socioeconomic History  . Marital status: Married    Spouse name: Not on file  . Number of children: 2  . Years of education: Not on file  . Highest education level: Not on file  Occupational History  . Not on file  Tobacco Use  . Smoking status: Never Smoker  . Smokeless tobacco: Never Used  Substance and Sexual Activity  . Alcohol use: No    Alcohol/week: 0.0 standard drinks  . Drug use: No  . Sexual activity: Not on file  Other Topics Concern  . Not on file  Social History Narrative  . Not on file   Social Determinants of Health   Financial Resource Strain:   . Difficulty of Paying Living Expenses: Not on file  Food Insecurity:   . Worried About Charity fundraiser in the Last Year: Not on file  . Ran Out of Food in the Last Year: Not on file  Transportation Needs:   . Lack of Transportation (Medical): Not on file  . Lack of Transportation (Non-Medical): Not on file  Physical Activity:   . Days of Exercise per Week: Not on file  . Minutes of Exercise per Session: Not on file  Stress:   . Feeling  of Stress : Not on file  Social Connections:   . Frequency of Communication with Friends and Family: Not on file  . Frequency of Social Gatherings with Friends and Family: Not on file  . Attends Religious Services: Not on file  . Active Member of Clubs or Organizations: Not on file  . Attends Archivist Meetings: Not on file  . Marital Status: Not on file  Intimate Partner Violence:   . Fear of Current or Ex-Partner: Not on file  . Emotionally Abused: Not on file  . Physically Abused: Not on file  . Sexually Abused: Not on file    Review of Systems  Constitution: Negative for decreased appetite, malaise/fatigue, weight gain and weight loss.  Eyes: Negative for visual disturbance.  Cardiovascular: Positive for chest pain. Negative for claudication, dyspnea on exertion, leg swelling, orthopnea, palpitations and syncope.    Respiratory: Positive for snoring. Negative for hemoptysis and wheezing.   Endocrine: Negative for cold intolerance and heat intolerance.  Hematologic/Lymphatic: Does not bruise/bleed easily.  Skin: Negative for nail changes.  Musculoskeletal: Positive for joint pain and joint swelling. Negative for muscle weakness and myalgias.  Gastrointestinal: Negative for abdominal pain, change in bowel habit, nausea and vomiting.  Neurological: Positive for dizziness (since fall and head injury in Apr 2019) and headaches. Negative for difficulty with concentration and focal weakness.  Psychiatric/Behavioral: Negative for altered mental status and suicidal ideas.  All other systems reviewed and are negative.     Objective:  Blood pressure 129/75, pulse 60, temperature 98 F (36.7 C), temperature source Temporal, resp. rate 14, height '5\' 9"'  (1.753 m), weight 252 lb (114.3 kg), SpO2 97 %. Body mass index is 37.21 kg/m.    Physical Exam  Constitutional: He is oriented to person, place, and time. Vital signs are normal. He appears well-developed and well-nourished.  Morbidly obese  HENT:  Head: Normocephalic and atraumatic.  Cardiovascular: Normal rate, regular rhythm, normal heart sounds and intact distal pulses.  Pulses:      Femoral pulses are 1+ on the right side and 1+ on the left side.      Popliteal pulses are 1+ on the right side and 1+ on the left side.       Dorsalis pedis pulses are 2+ on the right side and 2+ on the left side.  Pulmonary/Chest: Effort normal and breath sounds normal. No accessory muscle usage. No respiratory distress.  Abdominal: Soft. Bowel sounds are normal.  Musculoskeletal:        General: Normal range of motion.     Cervical back: Normal range of motion.  Neurological: He is alert and oriented to person, place, and time.  Skin: Skin is warm and dry.  Vitals reviewed.  Radiology: No results found.  Laboratory examination:    PCP labs 10/16/2019: Lipid Panel  completed 10/16/2019 HDL 41 MG/DL 10/16/2019 LDL 57.000 mg 10/16/2019 Cholesterol, total 123.000 m 10/16/2019 Triglycerides 127.000 10/16/2019 A1C N/D Glucose Random 95.000 mg 10/16/2019 MicroAlbumin Urine 12.000 10/16/2019 MicroAlbumin/Creat 14.3 MG/ 10/16/2019 BUN 17.000 mg 10/16/2019 Creatinine, Serum 1.400 mg/ 10/16/2019 TSH 1.720 micr 10/16/2019 FOBT: Normal 10/29/2019 PSA 0.620 ng/ 10/16/2019   10/06/2018: Creatinine 1.4, eGFR 50/61, Potassium 4.4, CMP normal. CBC normal. Cholesterol 133, triglycerides 95, HDL 49, LDL 65. TSH normal. Apolipoprotein B 60  CMP Latest Ref Rng & Units 05/01/2019 01/01/2018  Glucose 70 - 99 mg/dL 111(H) 114(H)  BUN 8 - 23 mg/dL 14 16  Creatinine 0.61 - 1.24 mg/dL 1.45(H) 1.49(H)  Sodium  135 - 145 mmol/L 142 141  Potassium 3.5 - 5.1 mmol/L 4.7 4.1  Chloride 98 - 111 mmol/L 109 111  CO2 22 - 32 mmol/L 24 22  Calcium 8.9 - 10.3 mg/dL 9.7 9.0  Total Protein 6.5 - 8.1 g/dL 7.0 -  Total Bilirubin 0.3 - 1.2 mg/dL 0.8 -  Alkaline Phos 38 - 126 U/L 74 -  AST 15 - 41 U/L 21 -  ALT 0 - 44 U/L 19 -   CBC Latest Ref Rng & Units 05/01/2019 01/01/2018  WBC 4.0 - 10.5 K/uL 7.4 7.0  Hemoglobin 13.0 - 17.0 g/dL 13.9 13.5  Hematocrit 39.0 - 52.0 % 43.1 41.2  Platelets 150 - 400 K/uL 177 163   Lipid Panel  No results found for: CHOL, TRIG, HDL, CHOLHDL, VLDL, LDLCALC, LDLDIRECT HEMOGLOBIN A1C No results found for: HGBA1C, MPG TSH No results for input(s): TSH in the last 8760 hours.  PRN Meds:. There are no discontinued medications. Current Meds  Medication Sig  . acetaminophen (TYLENOL) 500 MG tablet Take 500 mg by mouth every 6 (six) hours as needed.  Marland Kitchen allopurinol (ZYLOPRIM) 300 MG tablet Take 300 mg by mouth daily.   Marland Kitchen aspirin 81 MG tablet Take 81 mg by mouth daily.  Marland Kitchen atorvastatin (LIPITOR) 40 MG tablet Take 40 mg by mouth daily.  . famotidine (PEPCID) 40 MG tablet Take 40 mg by mouth 2 (two) times daily.  . Melatonin 10 MG CAPS Take 10 mg by mouth at bedtime.    . metoprolol succinate (TOPROL-XL) 25 MG 24 hr tablet Take 25 mg by mouth daily.  Vladimir Faster Glycol-Propyl Glycol (SYSTANE) 0.4-0.3 % GEL ophthalmic gel Place 1 application into the left eye every evening.  Marland Kitchen Respiratory Therapy Supplies (CARETOUCH 2 CPAP HOSE HANGER) MISC by Does not apply route.    Cardiac Studies:   Lexiscan Myoview stress test 06/25/2019: Lexiscan stress test was performed. Stress EKG is non-diagnostic, as this is pharmacological stress test. SPECT stress and rest images demonstrate medium sized area of mildly decreased uptake in inferior myocardium, more prominent on rest images. With normal wall thickening and wall motion, this area likely represents tissue attenuation. Stress LV EF is normal 67%.  Low risk study.   Echocardiogram 05/25/2019: Left ventricle cavity is normal in size. Normal left ventricular wall thickness. Abnormal septal wall motion due to post-operative valve. Normal LV systolic function with EF 55%. Diastolic function not assessed due to h/o mitral valve repair. Trileaflet aortic valve. Mild (Grade I) aortic regurgitation. S/p mitral valve repair. Mild mitral stenosis. Mean PG 2 mmHg at HR 59 bpm. MVA 1.6 cm2 by pressure half-time method. Mild (Grade I) mitral regurgitation. Mild tricuspid regurgitation.  No evidence of pulmonary hypertension. Compared to previous study in 2015, mild mitral stenosis is new.   Treadmill stress test [03/11/2014]: Indications: Screening for CAD. Conclusions: Normal Test. Negative for ischemia, The baseline ECG showed NSR,Normal ECG. During exercise there was no ST-T changes of ischemia. The patient exercised according to the Bruce protocol, Total time recorded 5 Min. 24 sec. achieving a max heart rate of 157 which was 100% of MPHR for age and 7.3 METS of work. Symptoms: THR achieved.. Arrhythmia: Occasional PVC at rest. continue primary prevention. Baseline NIBP was 132/82. Peak NIBP was 132/82 MaxSysp was: 178 MaxDiasp  was: 82.  Coronary Angiogram [2004]: no stents placed; done at [facility]:     ICD-10-CM   1. Benign essential hypertension  I10   2. Atypical chest  pain  R07.89 EKG 12-Lead  3. Premature atrial contraction  I49.1   4. History of mitral valve repair  Z98.890   5. Pure hypercholesterolemia  E78.00     EKG 11/02/2019: Normal sinus rhythm at 63 bpm with 1 PAC, normal axis, no evidence of ischemia. Low voltage complexes.   Recommendations:   Patient is here for 3 month office visit. He is overall doing well, continues to have atypical chest pain. Able to exercise without occurrence of chest pain, but does have intermittent episodes with going up a flight of stairs or just while sitting resting. Cardiac workup has been unyielding. Will consider noncardiac etiology. Question if related to GERD etiology. I have asked him to try OTC omeprazole for the next few weeks to see if he has any improvement. He also previously had improvement in his symptoms with more exercise, in which I have encouraged him to resume. If noncardiac etiology of chest pain is not found, will consider further cardiac workup.   Blood pressure is well controlled. No changes are noted to his EKG or physical exam. He does have 1 PAC on EKG today and notices irregular rhythm on his home BP cuff. Denies any palpitations. I have discussed etiology of PAC's and reassured him.   I have reviewed his labs from PCP office, lipids are well controlled. Continue with Lipitor. I have congratulated him on his recent weight loss and urged him to continue. I will plan to see him back in 2-3 months for follow up on his chest pain, but encouraged him to contact us sooner if needed.   Miquel Dunn, MSN, APRN, FNP-C [facility]. [place] Office: [phone] Fax: [phone]

## 2019-11-04 ENCOUNTER — Ambulatory Visit: Payer: PPO | Attending: Internal Medicine

## 2019-11-04 DIAGNOSIS — Z23 Encounter for immunization: Secondary | ICD-10-CM | POA: Insufficient documentation

## 2019-11-04 NOTE — Progress Notes (Signed)
   Covid-19 Vaccination Clinic  Name:  Luis Boone    MRN: [id number] DOB: 01-15-1951  11/04/2019  [name] was observed post Covid-19 immunization for 15 minutes without incidence. He was provided with Vaccine Information Sheet and instruction to access the V-Safe system.   [name] was instructed to call 911 with any severe reactions post vaccine: Marland Kitchen Difficulty breathing  . Swelling of your face and throat  . A fast heartbeat  . A bad rash all over your body  . Dizziness and weakness    Immunizations Administered    Name Date Dose VIS Date Route   Pfizer COVID-19 Vaccine 11/04/2019  9:25 AM 0.3 mL 08/31/2019 Intramuscular   Manufacturer: [place]   Lot: [id number]   [place]: [id number]

## 2019-11-13 DIAGNOSIS — B0052 Herpesviral keratitis: Secondary | ICD-10-CM | POA: Diagnosis not present

## 2019-11-13 DIAGNOSIS — H2513 Age-related nuclear cataract, bilateral: Secondary | ICD-10-CM | POA: Diagnosis not present

## 2019-11-13 DIAGNOSIS — H25013 Cortical age-related cataract, bilateral: Secondary | ICD-10-CM | POA: Diagnosis not present

## 2019-11-13 DIAGNOSIS — H52203 Unspecified astigmatism, bilateral: Secondary | ICD-10-CM | POA: Diagnosis not present

## 2019-11-27 ENCOUNTER — Ambulatory Visit: Payer: PPO | Attending: Internal Medicine

## 2019-11-27 DIAGNOSIS — Z23 Encounter for immunization: Secondary | ICD-10-CM

## 2019-11-27 NOTE — Progress Notes (Signed)
   Covid-19 Vaccination Clinic  Name:  Luis Boone    MRN: [id number] DOB: [birth year]  11/27/2019  [name] was observed post Covid-19 immunization for 30 minutes based on pre-vaccination screening without incident. He was provided with Vaccine Information Sheet and instruction to access the V-Safe system.   [name] was instructed to call 911 with any severe reactions post vaccine: Marland Kitchen Difficulty breathing  . Swelling of face and throat  . A fast heartbeat  . A bad rash all over body  . Dizziness and weakness   Immunizations Administered    Name Date Dose VIS Date Route   Pfizer COVID-19 Vaccine 11/27/2019  9:07 AM 0.3 mL 08/31/2019 Intramuscular   Manufacturer: [place]   Lot: [id number]   [place]: [id number]

## 2019-12-17 DIAGNOSIS — G4733 Obstructive sleep apnea (adult) (pediatric): Secondary | ICD-10-CM | POA: Diagnosis not present

## 2019-12-22 IMAGING — DX DG WRIST COMPLETE 3+V*L*
4 series · 4 of 4 positions shown · non-contrast
Comparison: None.

CLINICAL DATA: Pain following fall

EXAM:
LEFT WRIST - COMPLETE 3+ VIEW

[x wrist pa left]
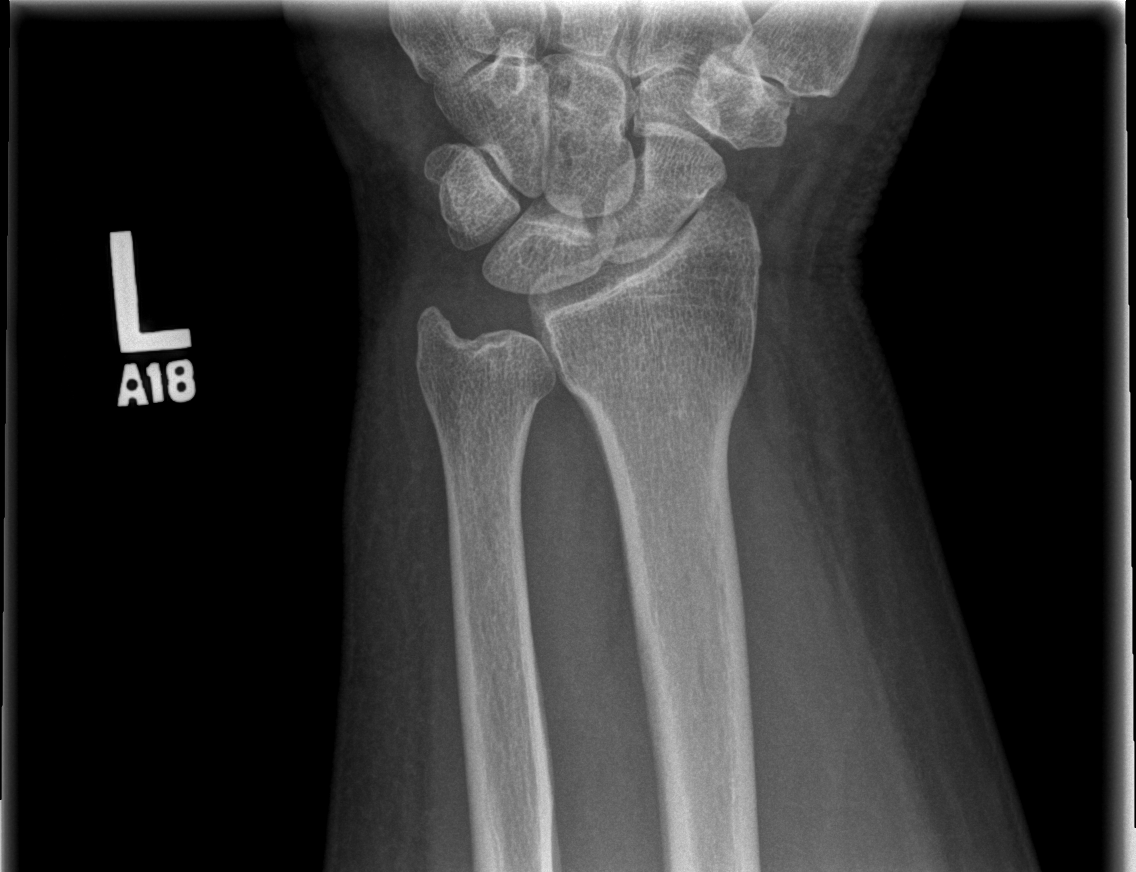

[x wrist obl left]
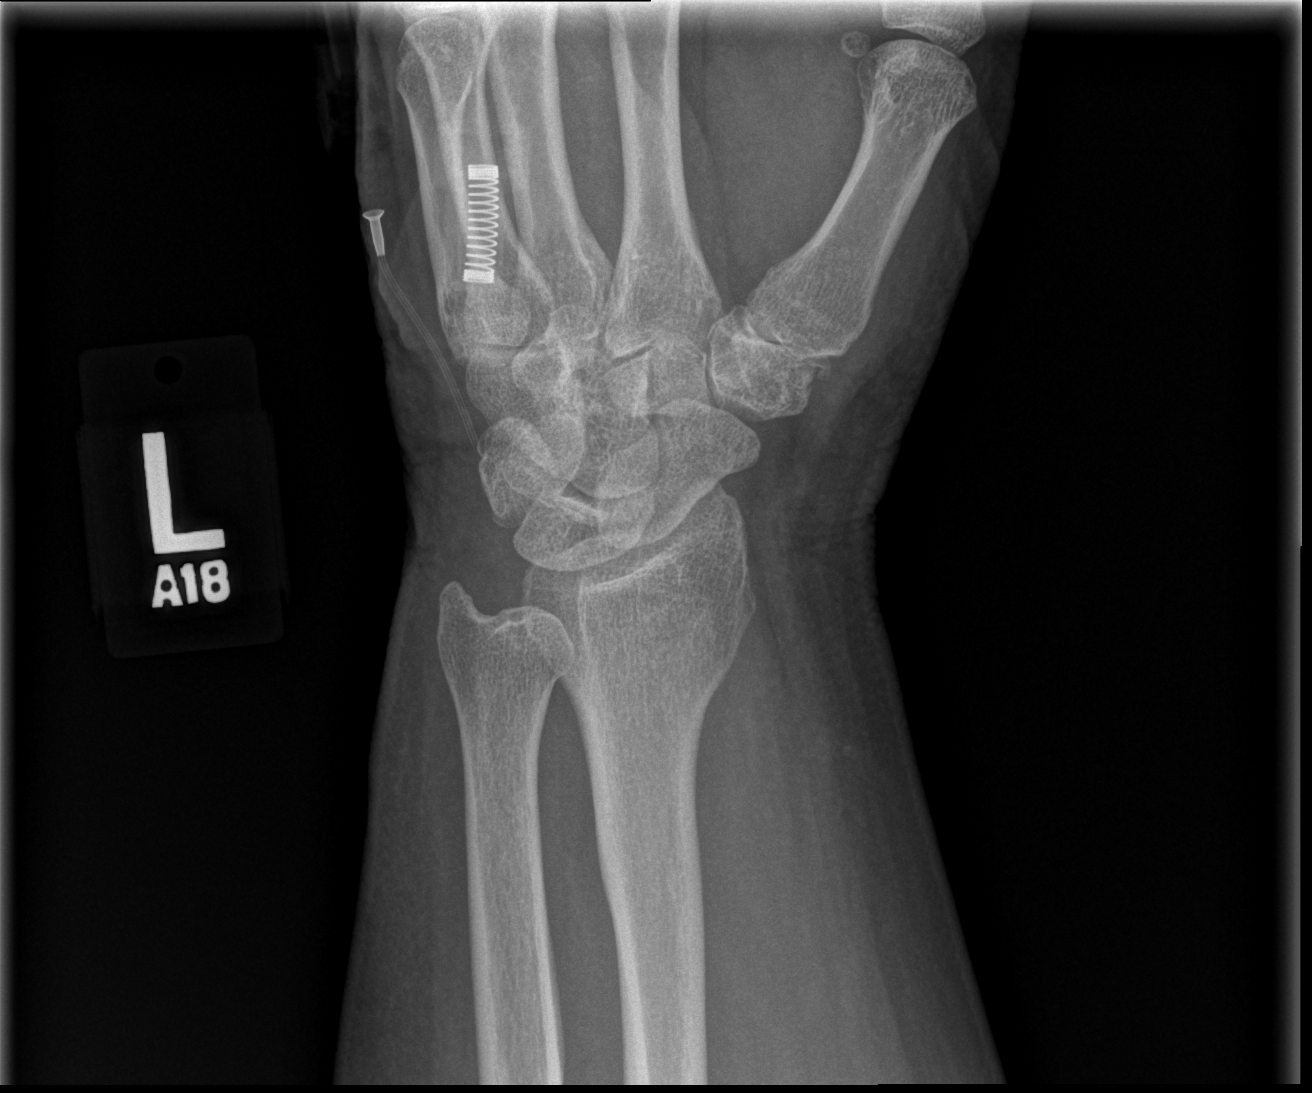

[x wrist lat left]
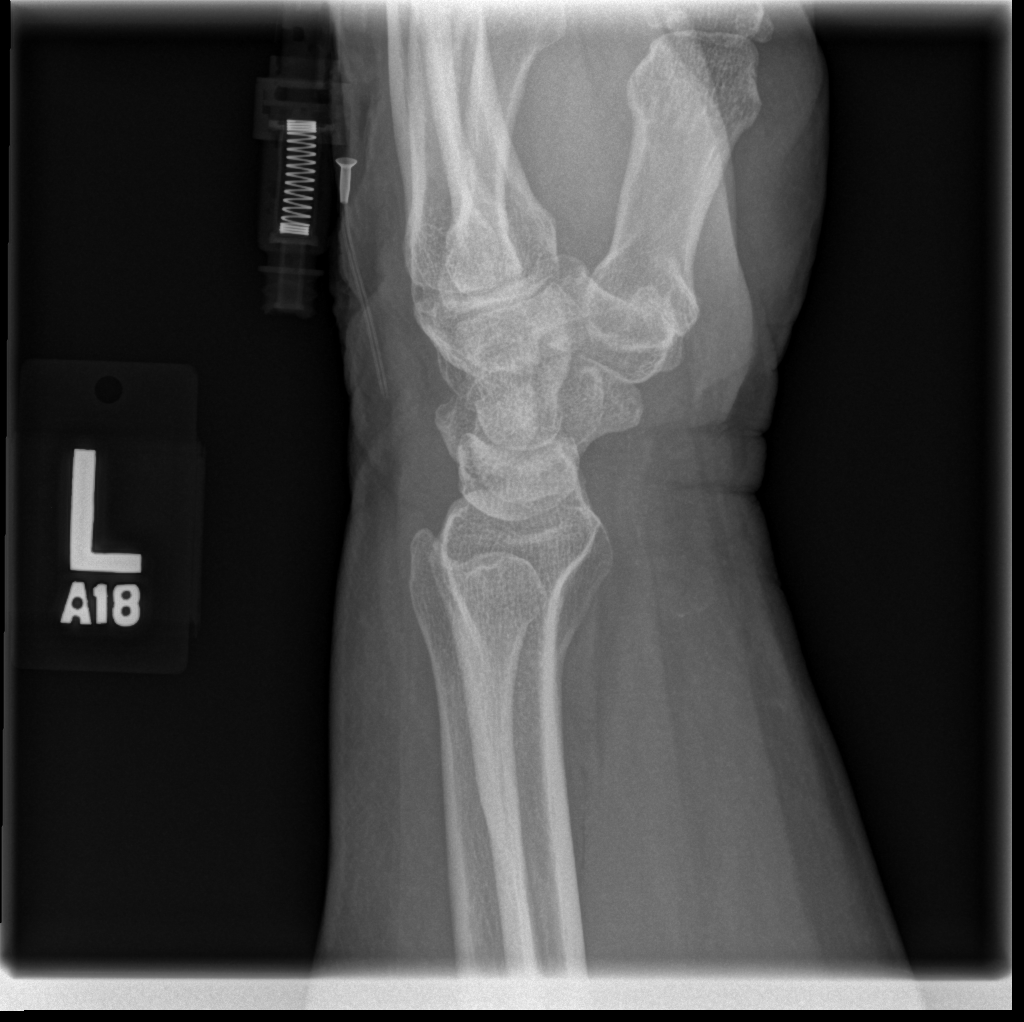

[x wrist navicular view left]
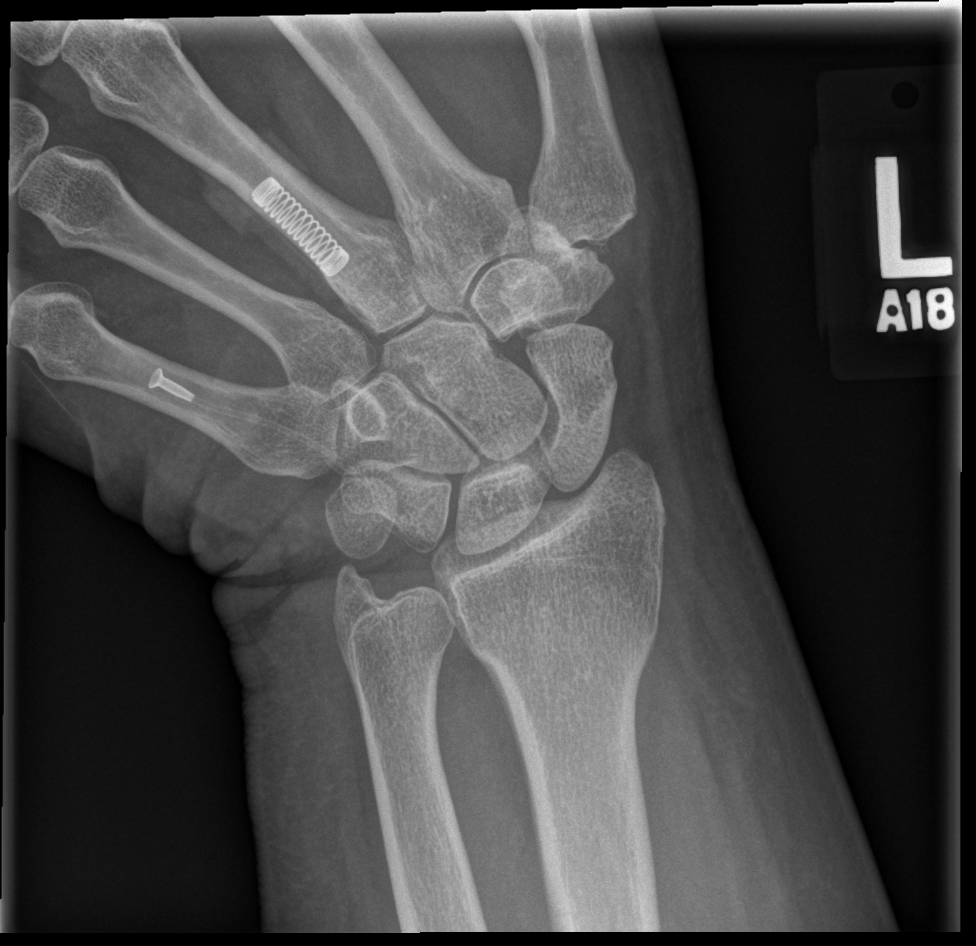

[4 of 4 positions shown; findings below may reference images not displayed]

FINDINGS: Frontal, oblique, lateral, and ulnar deviation scaphoid images were
obtained. There is no fracture or dislocation. There is
osteoarthritic change in the first carpal-metacarpal joint. Other
joint spaces appear normal. No erosive change.
IMPRESSION: Osteoarthritic change in first carpal-metacarpal joint. Other joint
spaces appear unremarkable. No fracture or dislocation.

## 2019-12-22 IMAGING — CT CT CERVICAL SPINE W/O CM
5 of 8 series · 14 of 33 positions shown, 15 images · non-contrast
Comparison: None.

CLINICAL DATA: Pain following trauma

EXAM:
CT HEAD WITHOUT CONTRAST
CT CERVICAL SPINE WITHOUT CONTRAST
TECHNIQUE: Multidetector CT imaging of the head and cervical spine was
performed following the standard protocol without intravenous
contrast. Multiplanar CT image reconstructions of the cervical spine
were also generated.

[Series 5: head bone · axial · 0.50mm/px · z∈[-89,-33]mm · 2 of 84 slices shown]
[im 28/84  bone]
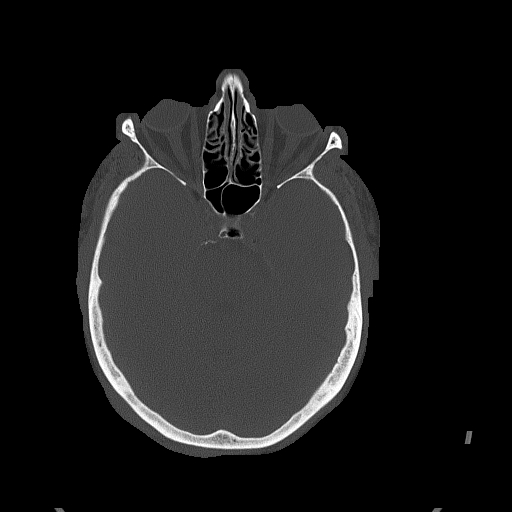
[im 56/84  bone]
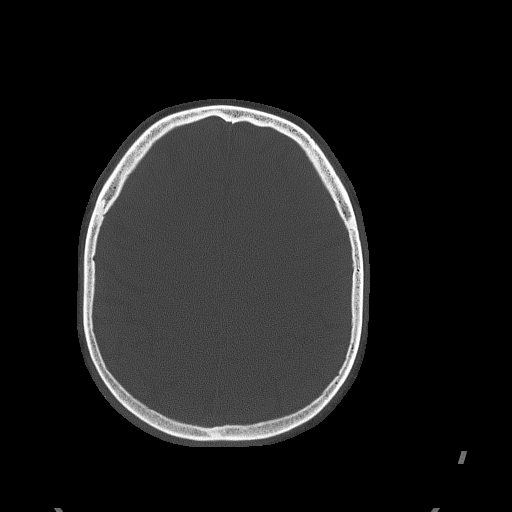

[Series 8: c_spine 2.0 st · axial · 0.39mm/px · z∈[-250,-150]mm · 3 of 102 slices shown, 4 images]
[im 26/102  soft-tissue]
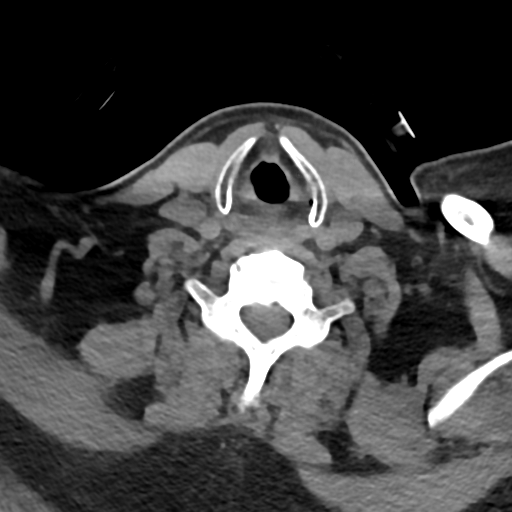
[im 26/102  bone]
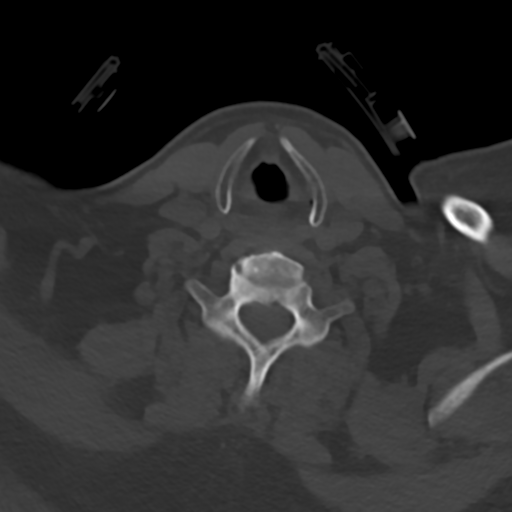
[im 51/102  bone]
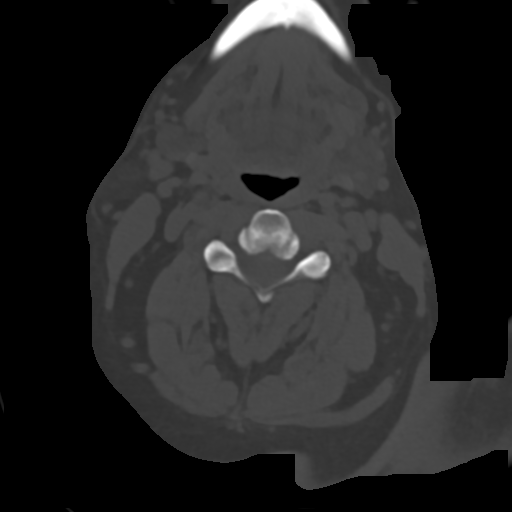
[im 76/102  bone]
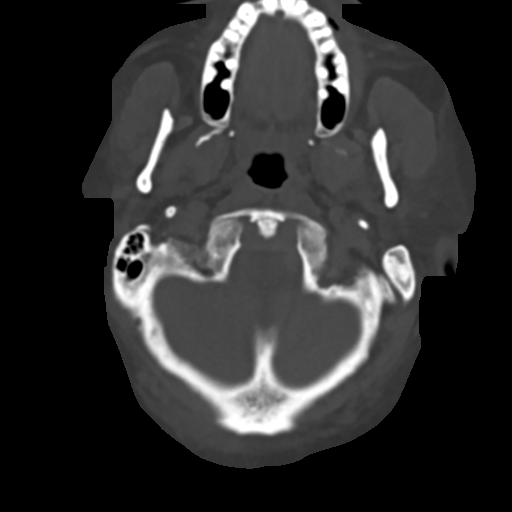

[Series 10: c_spine 2.0 sag bone · sagittal · 0.43mm/px · 5 of 93 slices shown]
[im 16/93  bone]
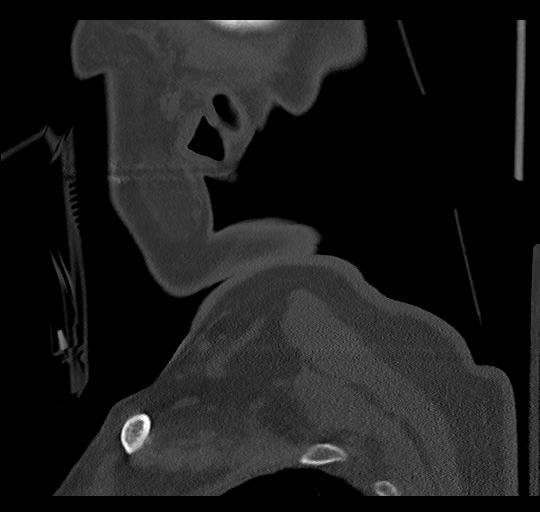
[im 31/93  bone]
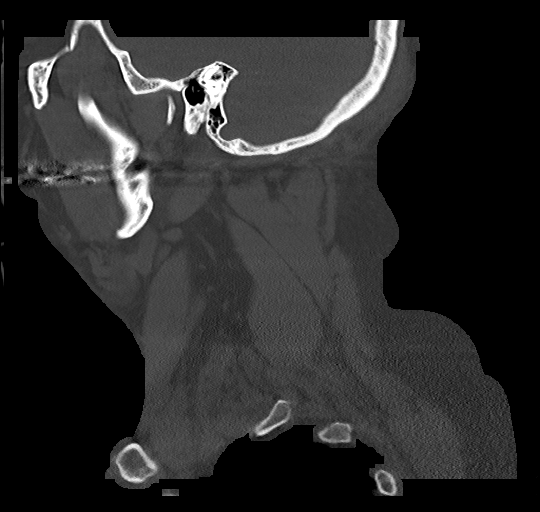
[im 47/93  bone]
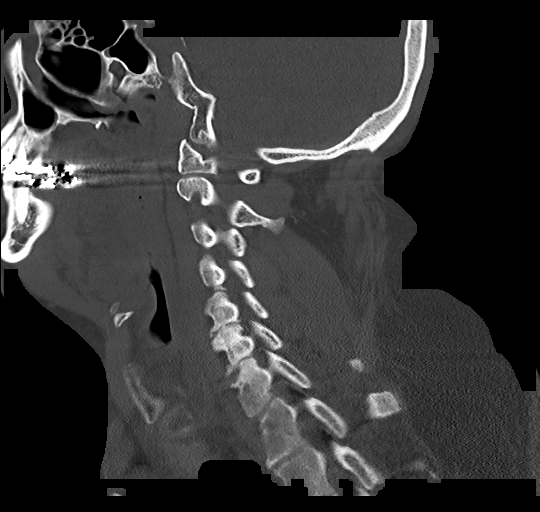
[im 62/93  bone]
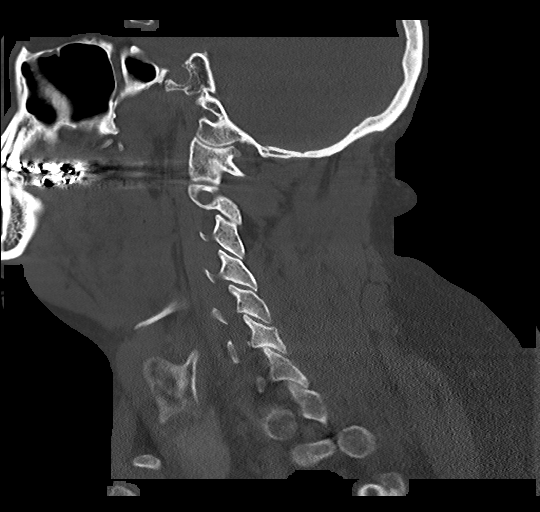
[im 77/93  bone]
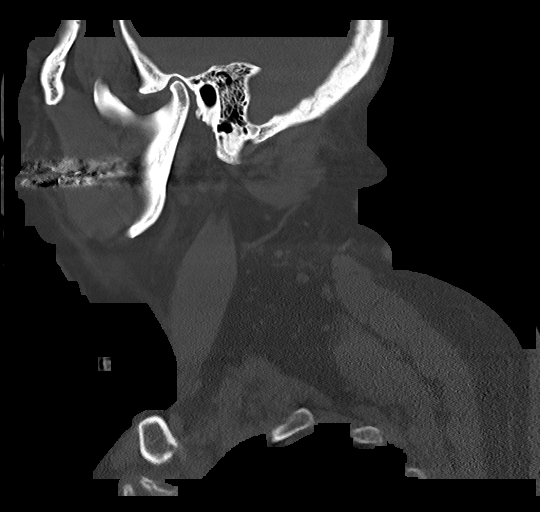

[Series 11: c_spine 2.0 cor bone · coronal · 0.40mm/px · 2 of 108 slices shown]
[im 36/108  bone]
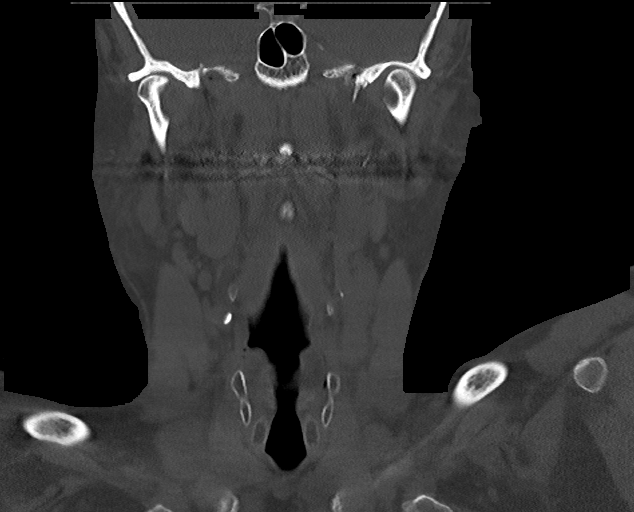
[im 72/108  bone]
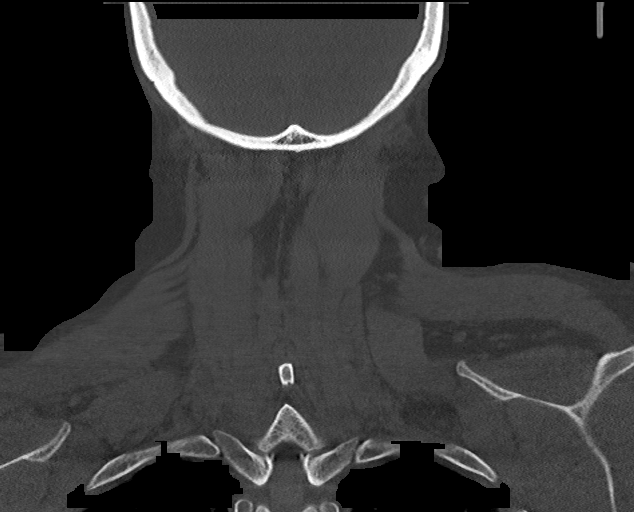

[Series 12: c_spine 2.0 orthogonals · axial · 0.34mm/px · z∈[-276,-214]mm · 2 of 92 slices shown]
[im 31/92  bone]
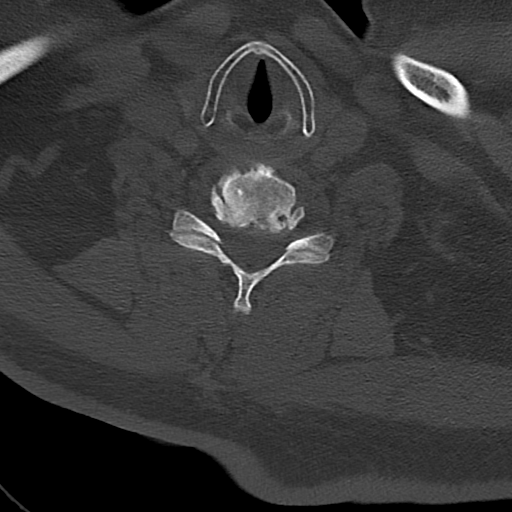
[im 61/92  bone]
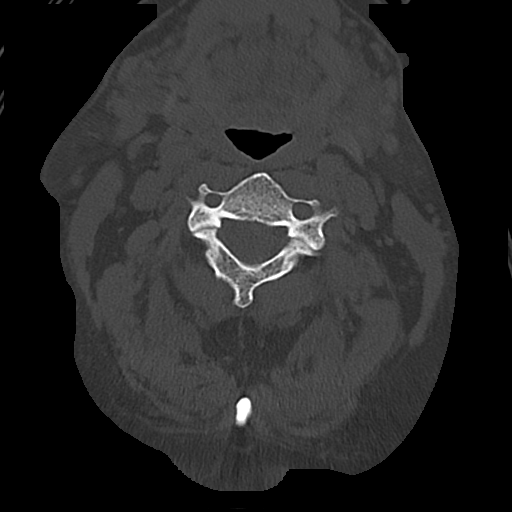

[14 of 33 positions shown; findings below may reference images not displayed]

FINDINGS: CT HEAD FINDINGS

Brain: There is mild diffuse atrophy. There is no intracranial mass,
hemorrhage, extra-axial fluid collection, or midline shift. There is
mild patchy small vessel disease in the centra semiovale
bilaterally. Elsewhere gray-white compartments appear normal. No
evident acute infarct.

Vascular: No hyperdense vessel. There are foci of calcification in
each carotid siphon region.

Skull: Bony calvarium appears intact. There is a right parietal and
occipital scalp hematoma.

Sinuses/Orbits: There is a small air-fluid level in the right
maxillary antrum. There is mucosal thickening in multiple ethmoid
air cells. There is also mild mucosal thickening in the inferior
left frontal sinus. Orbits appear symmetric bilaterally.

Other: Mastoid air cells are clear.

CT CERVICAL SPINE FINDINGS

Alignment: There is no appreciable spondylolisthesis.

Skull base and vertebrae: Skull base and craniocervical junction
regions appear normal. No fracture. No blastic or lytic bone
lesions.

Soft tissues and spinal canal: Prevertebral soft tissues and
predental space regions are normal. No paraspinous lesion. No
evident cord or canal hematoma.

Disc levels: There is moderately severe disc space narrowing at
C5-6, C6-7, and C7-T1. There are anterior osteophytes at C4, C5, C6,
and C7. There is calcification in the anterior ligament at C4-5 and
C5-6. There is facet hypertrophy at several levels bilaterally.
There is exit foraminal narrowing with impression on exiting nerve
roots at C5-6 bilaterally. No disc extrusion or high-grade stenosis.

Upper chest: Visualized upper lung zones are clear.

Other: There are foci of calcification in each carotid artery.
IMPRESSION: CT head: Mild atrophy with mild patchy periventricular small vessel
disease. No acute infarct. No mass or hemorrhage.

Foci of arterial vascular calcification noted. Small parietal and
occipital scalp hematoma on the right. No fracture. There are areas
of paranasal sinus disease.

CT cervical spine: No fracture or spondylolisthesis. Areas of
osteoarthritic change, most notably at C5-6. There are foci of
carotid artery calcification bilaterally.

## 2020-01-11 DIAGNOSIS — B0052 Herpesviral keratitis: Secondary | ICD-10-CM | POA: Diagnosis not present

## 2020-01-11 DIAGNOSIS — H2513 Age-related nuclear cataract, bilateral: Secondary | ICD-10-CM | POA: Diagnosis not present

## 2020-01-30 ENCOUNTER — Ambulatory Visit: Payer: PPO | Admitting: Cardiology

## 2020-02-20 DIAGNOSIS — H1789 Other corneal scars and opacities: Secondary | ICD-10-CM | POA: Diagnosis not present

## 2020-02-20 DIAGNOSIS — H2513 Age-related nuclear cataract, bilateral: Secondary | ICD-10-CM | POA: Diagnosis not present

## 2020-03-19 DIAGNOSIS — G4733 Obstructive sleep apnea (adult) (pediatric): Secondary | ICD-10-CM | POA: Diagnosis not present

## 2020-03-28 DIAGNOSIS — L259 Unspecified contact dermatitis, unspecified cause: Secondary | ICD-10-CM | POA: Diagnosis not present

## 2020-03-28 DIAGNOSIS — L270 Generalized skin eruption due to drugs and medicaments taken internally: Secondary | ICD-10-CM | POA: Diagnosis not present

## 2020-06-03 DIAGNOSIS — E785 Hyperlipidemia, unspecified: Secondary | ICD-10-CM | POA: Diagnosis not present

## 2020-06-03 DIAGNOSIS — N1831 Chronic kidney disease, stage 3a: Secondary | ICD-10-CM | POA: Diagnosis not present

## 2020-06-03 DIAGNOSIS — Z23 Encounter for immunization: Secondary | ICD-10-CM | POA: Diagnosis not present

## 2020-06-03 DIAGNOSIS — M1990 Unspecified osteoarthritis, unspecified site: Secondary | ICD-10-CM | POA: Diagnosis not present

## 2020-06-03 DIAGNOSIS — I129 Hypertensive chronic kidney disease with stage 1 through stage 4 chronic kidney disease, or unspecified chronic kidney disease: Secondary | ICD-10-CM | POA: Diagnosis not present

## 2020-06-03 DIAGNOSIS — J309 Allergic rhinitis, unspecified: Secondary | ICD-10-CM | POA: Diagnosis not present

## 2020-06-03 DIAGNOSIS — G4733 Obstructive sleep apnea (adult) (pediatric): Secondary | ICD-10-CM | POA: Diagnosis not present

## 2020-06-03 DIAGNOSIS — Z952 Presence of prosthetic heart valve: Secondary | ICD-10-CM | POA: Diagnosis not present

## 2020-06-03 DIAGNOSIS — D126 Benign neoplasm of colon, unspecified: Secondary | ICD-10-CM | POA: Diagnosis not present

## 2020-06-03 DIAGNOSIS — B0050 Herpesviral ocular disease, unspecified: Secondary | ICD-10-CM | POA: Diagnosis not present

## 2020-06-03 DIAGNOSIS — M109 Gout, unspecified: Secondary | ICD-10-CM | POA: Diagnosis not present

## 2020-06-17 DIAGNOSIS — G4733 Obstructive sleep apnea (adult) (pediatric): Secondary | ICD-10-CM | POA: Diagnosis not present

## 2020-08-11 ENCOUNTER — Encounter: Payer: Self-pay | Admitting: Neurology

## 2020-08-11 ENCOUNTER — Ambulatory Visit: Payer: PPO | Admitting: Neurology

## 2020-08-11 VITALS — BP 148/91 | HR 77 | Ht 68.0 in | Wt 248.0 lb

## 2020-08-11 DIAGNOSIS — Z9989 Dependence on other enabling machines and devices: Secondary | ICD-10-CM

## 2020-08-11 DIAGNOSIS — G4733 Obstructive sleep apnea (adult) (pediatric): Secondary | ICD-10-CM | POA: Diagnosis not present

## 2020-08-11 NOTE — Progress Notes (Signed)
Subjective:    Patient ID: Luis Boone is a 69 y.o. male.  HPI     Interim history:   Luis Boone is a 69-year-old right-handed gentleman with an underlying medical history of hypertension, hyperlipidemia, hiatal hernia, peptic ulcer disease, depression, gout, mitral valve repair, arthritis and status post arthroscopic knee surgeries, as well as obesity, who presents for follow-up consultation of his obstructive sleep apnea, on CPAP therapy. The patient is unaccompanied today and presents for his 1 year check up.   He saw Megan Millikan, nurse practitioner on 08/09/2019, at which time he was compliant with his CPAP.  He was advised to follow-up in 1 year.  Today, 08/11/2020: I reviewed his CPAP compliance data from 07/08/2020 through 08/06/2020, which is a total of 30 days, during which time he used his machine 29 days with percent use days greater than 4 hours at 70%, indicating adequate compliance with an average usage of 5 hours and 41 minutes, residual AHI borderline at 4.5/h, central AHI 1.6/h, pressure of 15 cm, leak on the higher side with a 95th percentile at 23.2 L/min.  He reports overall doing well with his CPAP but sometimes the mask does not stay in place, the headgear stretches out within less than 6 months and he can only get 1 every 6 months per insurance.  He recently paid out of pocket for headgear to replace it.  Sometimes he does not get enough usage when he has to get up early to take his son to the airport.  He has 2 sons, 29-year-old got married recently.  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  Patient reports residual left knee pain.  He had arthroscopic surgery and February 2020.  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  He will eventually need bilateral cataract repairs.  Headaches are not significant currently, he does not take  Tylenol on a daily or regular basis any longer.  He had chest pain and went to the ER for this last year.  Sadly, he lost his mother in December 2020.  He lost his father-in-law in December 2019.  The patient's allergies, current medications, family history, past medical history, past social history, past surgical history and problem list were reviewed and updated as appropriate.    Previously (copied from previous notes for reference):    I saw him on 02/20/2018 for a sooner than scheduled appointment because of a fall in April which resulted in reported loss of consciousness and he had hit his head. We reviewed ER records and imaging studies at the time. He did not have any acute abnormalities on CT scans of his head and neck, I did not suggest any new medications at the time, and cautioned him regarding the daily use of Tylenol. He was back on track with his CPAP at the time.   08/07/2018: I reviewed his CPAP compliance data from 07/04/2018 through 08/02/2018, which is a total of 30 days, during which time he used his CPAP every night with percent used days greater than 4 hours at 97%, indicating excellent compliance. Average usage of 7 hours and 16 minutes, residual AHI at goal at 2.3 per hour, leak acceptable with the 95th percentile at 15.6 L/m on a pressure of 15 cm with EPR of 3.   I saw him for this on 08/04/2017, at   which time he was fully compliant with CPAP therapy.   I saw him on 02/01/2017, at which time he reported doing well, he had adjusted well to CPAP therapy and indicated improvement of his sleep quality and daytime somnolence and did not feel he needed to take a nap on a day-to-day basis any longer. He was encouraged to continue with CPAP therapy and he was compliant at the time.    I reviewed his CPAP compliance data from 07/04/2017 through 08/02/2017 which is a total of 30 days, during which time he used his CPAP every night with percent used days greater than 4 hours at 100%,  indicating superb compliance with an average usage of 7 hours and 12 minutes, residual AHI at goal at 2.3 per hour, leak acceptable with the 95th percentile at 14.1 L/m on a pressure of 15 cm with EPR of 3. He reports that in the past 3 weeks, he has not slept very well, will be getting a new mask and hopes, it will help.    I first met him on 08/31/2016 at the request of his primary care physician, at which time the patient reported snoring, excessive daytime somnolence as well as morning headaches. I invited him for sleep study testing. He had a split-night sleep study on 11/14/2016. I went over his test results with him in detail today. Baseline sleep efficiency was 73.4%, sleep latency 18.5 minutes, REM was absent. He had no slow-wave sleep. Total AHI was 58.8 per hour, average oxygen saturation was 92%, nadir was 85%. No significant PLMS were noted. He was titrated on CPAP with a pressure of 5 cm to 15 cm. AHI was 0 per hour on the final pressure, O2 nadir of 95% and supine non-REM sleep was achieved. He achieved 30.4% of slow-wave sleep and REM sleep at 7.8% during the titration portion of the study. Average oxygen saturation was 94%, nadir was 91%. He had no significant PLMS during the second part of the study, no significant EKG changes. He was fitted with a medium full face mask which he preferred over the nasal mask. Based on his test results are prescribed CPAP therapy for home use.   I reviewed his CPAP compliance data from 01/01/2017 through 01/30/2017 which is a total of 30 days, during which time he used his CPAP every night with percent used days greater than 4 hours at 100%, indicating superb compliance with an average usage of 7 hours and 5 minutes, residual AHI 4.2 per hour, leak acceptable for the 95th percentile at 8 L/m on a pressure of 15 cm with EPR of 3.    08/31/16: He reports snoring and excessive daytime somnolence as well as morning headaches. I reviewed your office note from  08/26/2016, which you kindly included.   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 He has 2 grown children (one adopted, one biological). He does not smoke or drink alcohol. He is going to retire by the end of this year. Bedtime is about 10:00 to 10:30 PM, and likes to read on his [place], is asleep latest by 11 PM.  His Rise time is 5:15 AM. He does not always wake up rested. He has had fairly frequent morning headaches in the past few months, snoring has become worse in the past 12-18 months and he has also been noted to sleep talking the past 12 months per wife's report. His Epworth sleepiness score is 15 out of   24 today, his fatigue score is 41 out of 63. His sister may have obstructive sleep apnea and uses a CPAP machine as far as he can recall. He would be willing to try CPAP therapy. He denies restless legs symptoms and is not sure if he twitches or kicks his legs in his sleep. He has nocturia about 2-3 times per average night. Weight has been fluctuating. He is working on weight loss. His dentist made him aware that his airway is crowded and small and that he may need to be tested for sleep apnea. He had recent blood work in your office on 08/19/2016 and I reviewed the results. PSA was 0.502, lipid profile was good with total cholesterol at 114, LDL 55, HDL 44, liver function test normal, BUN and creatinine normal.  He is a nonsmoker, does not drink alcohol, drinks caffeine in limitation, usually one per day in the morning in the form of coffee.   His Past Medical History Is Significant For: Past Medical History:  Diagnosis Date  . Allergy    seasonal  . Arthritis   . Depression   . GERD (gastroesophageal reflux disease)   . Gout   . Heart murmur    heart valve repair  . Hiatal hernia   . HLD (hyperlipidemia)   . Hypertension   . Mitral murmur   . Obesity   . Ocular herpes simplex   . PUD (peptic ulcer disease)   . Sleep apnea    use CPAP     His Past Surgical History Is Significant For: Past Surgical History:  Procedure Laterality Date  . COLONOSCOPY  2005,10/02/2014  . KNEE ARTHROSCOPY Right 2006  . KNEE SURGERY  10/2018   meniscus repair  . MITRAL VALVE REPAIR  2004  . POLYPECTOMY      His Family History Is Significant For: Family History  Problem Relation Age of Onset  . Migraines Mother   . Hypertension Mother   . Dementia Mother   . Cancer Father   . Arthritis Father   . Heart attack Father   . Stomach cancer Paternal Grandmother   . Colon cancer Neg Hx   . Colon polyps Neg Hx   . Esophageal cancer Neg Hx   . Rectal cancer Neg Hx     His Social History Is Significant For: Social History   Socioeconomic History  . Marital status: Married    Spouse name: Not on file  . Number of children: 2  . Years of education: Not on file  . Highest education level: Not on file  Occupational History  . Not on file  Tobacco Use  . Smoking status: Never Smoker  . Smokeless tobacco: Never Used  Vaping Use  . Vaping Use: Never used  Substance and Sexual Activity  . Alcohol use: No    Alcohol/week: 0.0 standard drinks  . Drug use: No  . Sexual activity: Not on file  Other Topics Concern  . Not on file  Social History Narrative  . Not on file   Social Determinants of Health   Financial Resource Strain:   . Difficulty of Paying Living Expenses: Not on file  Food Insecurity:   . Worried About Running Out of Food in the Last Year: Not on file  . Ran Out of Food in the Last Year: Not on file  Transportation Needs:   . Lack of Transportation (Medical): Not on file  . Lack of Transportation (Non-Medical): Not on file  Physical Activity:   .   Days of Exercise per Week: Not on file  . Minutes of Exercise per Session: Not on file  Stress:   . Feeling of Stress : Not on file  Social Connections:   . Frequency of Communication with Friends and Family: Not on file  . Frequency of Social Gatherings with  Friends and Family: Not on file  . Attends Religious Services: Not on file  . Active Member of Clubs or Organizations: Not on file  . Attends Archivist Meetings: Not on file  . Marital Status: Not on file    His Allergies Are:  Allergies  Allergen Reactions  . Prilosec [Omeprazole] Rash  . Vicodin [Hydrocodone-Acetaminophen] Rash  . Other Rash    Medication to sedate him for a colonoscopy. 10-02-2014 propofol  . Tramadol Rash  :   His Current Medications Are:  Outpatient Encounter Medications as of 08/11/2020  Medication Sig  . acetaminophen (TYLENOL) 500 MG tablet Take 500 mg by mouth every 6 (six) hours as needed.  Marland Kitchen allopurinol (ZYLOPRIM) 300 MG tablet Take 300 mg by mouth daily.   Marland Kitchen aspirin 81 MG tablet Take 81 mg by mouth daily.  Marland Kitchen atorvastatin (LIPITOR) 40 MG tablet Take 40 mg by mouth daily.  . famotidine (PEPCID) 40 MG tablet Take 40 mg by mouth 2 (two) times daily.  . Melatonin 10 MG CAPS Take 10 mg by mouth at bedtime.   . metoprolol succinate (TOPROL-XL) 25 MG 24 hr tablet Take 25 mg by mouth daily.  Vladimir Faster Glycol-Propyl Glycol (SYSTANE) 0.4-0.3 % GEL ophthalmic gel Place 1 application into the left eye every evening.  Marland Kitchen Respiratory Therapy Supplies (CARETOUCH 2 CPAP HOSE HANGER) MISC by Does not apply route.  . triamcinolone ointment (KENALOG) 0.1 % Apply topically.   No facility-administered encounter medications on file as of 08/11/2020.  :  Review of Systems:  Out of a complete 14 point review of systems, all are reviewed and negative with the exception of these symptoms as listed below: Review of Systems  Neurological:       Pt presents today to discuss his cpap. Pt reports the mask stretches out easier than it used to.    Objective:  Neurological Exam  Physical Exam Physical Examination:   Vitals:   08/11/20 1257  BP: (!) 148/91  Pulse: 77    General Examination: The patient is a very pleasant 69 y.o. male in no acute distress. He  appears well-developed and well-nourished and well groomed.   HEENT:Normocephalic, pupils are equal, round and reactive to light, mild irritation noted both lower eyelids, cataracts noted bilaterally, corrective eyeglasses in place. Extraocular tracking is good without limitation to gaze excursion or nystagmus noted. Normal smooth pursuit is noted. Hearing is grossly intact. Face is symmetric with normal facial animation. Speech is clear with no dysarthria noted. There is no hypophonia. There is no lip, neck/head, jaw or voice tremor. Neck is shows FROM. Oropharynx exam reveals:mildmouth dryness, adequatedental hygiene and markedairway crowding. Tongue protrudes centrally and palate elevates symmetrically.   Chest:Clear to auscultation without wheezing, rhonchi or crackles noted.  Heart:S1+S2+0, regular and normal without murmurs, rubs or gallops noted.   Abdomen:Soft, non-tender and non-distended with normal bowel sounds appreciated on auscultation.  Extremities:There is nopitting edema in the distal lower extremities bilaterally.  Nonpitting puffiness noticed distal lower extremities bilaterally.  Skin: Warm and dry without trophic changes noted.   Musculoskeletal: exam reveals mild swelling left knee medially, some discomfort in the left knee and right hip.  Neurologically:  Mental status: The patient is awake, alert and oriented in all 4 spheres. Hisimmediate and remote memory, attention, language skills and fund of knowledge are appropriate. There is no evidence of aphasia, agnosia, apraxia or anomia. Speech is clear with normal prosody and enunciation. Thought process is linear. Mood is normaland affect is normal.  Cranial nerves II - XII are as described above under HEENT exam.  Motor exam: Normal bulk, strength and tone is noted. There is no tremor. Fine motor skills and coordination:grosslyintact.  Cerebellar testing: No dysmetria or intention tremor. There is no  truncal or gait ataxia.  Sensory exam: intact to light touch.  Gait, station and balance: Hestands easily. No veering to one side is noted. No leaning to one side is noted. Posture is age-appropriate and stance is narrow based. Gait shows normalstride length and normalpace. No obvious limp.  Assessment and Plan:   In summary, Luis Mclaren Brownis a very pleasant 69 year old malewith an underlying medical history of hypertension, hyperlipidemia, hiatal hernia, peptic ulcer disease, depression, gout, mitral valve repair, arthritis and status post arthroscopic knee surgeries, as well as obesity, whopresents forfollow-up consultation of his obstructive sleep apnea for his yearly checkup. He had a fall in April 2019 with laceration to the head, CT scans showed degenerative changes and chronic changes but nothing acute thankfully. His headaches have improved, he does not take Tylenol on a daily basis. He is compliant with CPAP and continues to benefit from it.  He is commended on his treatment adherence.  I have updated his CPAP supply prescription.  He is advised to continue to use his CPAP regularly. Of note, he had a split-night sleep study on02/25/2018(withbaseline AHIof 58.8 per hour, O2 nadirof85%). Hehas benefited from treatment, CPAP continues to be at 15 cm.  He uses a full facemask.  He is advised to follow-up routinely in one year, he can see one of our nurse practitioners. I answered all his questions today and he was in agreement. I spent 20 minutes in total face-to-face time and in reviewing records during pre-charting, more than 50% of which was spent in counseling and coordination of care, reviewing test results, reviewing medications and treatment regimen and/or in discussing or reviewing the diagnosis of OSA, the prognosis and treatment options. Pertinent laboratory and imaging test results that were available during this visit with the patient were reviewed by me and considered in my  medical decision making (see chart for details).

## 2020-08-11 NOTE — Patient Instructions (Addendum)
It was good to see you again!  Please continue using your CPAP regularly. While your insurance requires that you use CPAP at least 4 hours each night on 70% of the nights, I recommend, that you not skip any nights and use it throughout the night if you can. Getting used to CPAP and staying with the treatment long term does take time and patience and discipline. Untreated obstructive sleep apnea when it is moderate to severe can have an adverse impact on cardiovascular health and raise her risk for heart disease, arrhythmias, hypertension, congestive heart failure, stroke and diabetes. Untreated obstructive sleep apnea causes sleep disruption, nonrestorative sleep, and sleep deprivation. This can have an impact on your day to day functioning and cause daytime sleepiness and impairment of cognitive function, memory loss, mood disturbance, and problems focussing. Using CPAP regularly can improve these symptoms.  We can see you in 1 year, you can see one of our nurse practitioners.

## 2020-08-26 DIAGNOSIS — H2513 Age-related nuclear cataract, bilateral: Secondary | ICD-10-CM | POA: Diagnosis not present

## 2020-08-26 DIAGNOSIS — H524 Presbyopia: Secondary | ICD-10-CM | POA: Diagnosis not present

## 2020-08-26 DIAGNOSIS — H25013 Cortical age-related cataract, bilateral: Secondary | ICD-10-CM | POA: Diagnosis not present

## 2020-08-26 DIAGNOSIS — B0052 Herpesviral keratitis: Secondary | ICD-10-CM | POA: Diagnosis not present

## 2020-09-02 DIAGNOSIS — B0052 Herpesviral keratitis: Secondary | ICD-10-CM | POA: Diagnosis not present

## 2020-09-15 DIAGNOSIS — G4733 Obstructive sleep apnea (adult) (pediatric): Secondary | ICD-10-CM | POA: Diagnosis not present

## 2020-10-03 DIAGNOSIS — H40052 Ocular hypertension, left eye: Secondary | ICD-10-CM | POA: Diagnosis not present

## 2020-10-03 DIAGNOSIS — B0052 Herpesviral keratitis: Secondary | ICD-10-CM | POA: Diagnosis not present

## 2020-10-17 DIAGNOSIS — H1789 Other corneal scars and opacities: Secondary | ICD-10-CM | POA: Diagnosis not present

## 2020-10-17 DIAGNOSIS — Z125 Encounter for screening for malignant neoplasm of prostate: Secondary | ICD-10-CM | POA: Diagnosis not present

## 2020-10-17 DIAGNOSIS — B0052 Herpesviral keratitis: Secondary | ICD-10-CM | POA: Diagnosis not present

## 2020-10-17 DIAGNOSIS — E785 Hyperlipidemia, unspecified: Secondary | ICD-10-CM | POA: Diagnosis not present

## 2020-10-17 DIAGNOSIS — M109 Gout, unspecified: Secondary | ICD-10-CM | POA: Diagnosis not present

## 2020-10-22 DIAGNOSIS — M109 Gout, unspecified: Secondary | ICD-10-CM | POA: Diagnosis not present

## 2020-10-22 DIAGNOSIS — Z952 Presence of prosthetic heart valve: Secondary | ICD-10-CM | POA: Diagnosis not present

## 2020-10-22 DIAGNOSIS — Z1331 Encounter for screening for depression: Secondary | ICD-10-CM | POA: Diagnosis not present

## 2020-10-22 DIAGNOSIS — M1990 Unspecified osteoarthritis, unspecified site: Secondary | ICD-10-CM | POA: Diagnosis not present

## 2020-10-22 DIAGNOSIS — G4733 Obstructive sleep apnea (adult) (pediatric): Secondary | ICD-10-CM | POA: Diagnosis not present

## 2020-10-22 DIAGNOSIS — E785 Hyperlipidemia, unspecified: Secondary | ICD-10-CM | POA: Diagnosis not present

## 2020-10-22 DIAGNOSIS — R82998 Other abnormal findings in urine: Secondary | ICD-10-CM | POA: Diagnosis not present

## 2020-10-22 DIAGNOSIS — B0050 Herpesviral ocular disease, unspecified: Secondary | ICD-10-CM | POA: Diagnosis not present

## 2020-10-22 DIAGNOSIS — I129 Hypertensive chronic kidney disease with stage 1 through stage 4 chronic kidney disease, or unspecified chronic kidney disease: Secondary | ICD-10-CM | POA: Diagnosis not present

## 2020-10-22 DIAGNOSIS — N1831 Chronic kidney disease, stage 3a: Secondary | ICD-10-CM | POA: Diagnosis not present

## 2020-10-22 DIAGNOSIS — J309 Allergic rhinitis, unspecified: Secondary | ICD-10-CM | POA: Diagnosis not present

## 2020-10-22 DIAGNOSIS — Z0000 Encounter for general adult medical examination without abnormal findings: Secondary | ICD-10-CM | POA: Diagnosis not present

## 2020-10-23 DIAGNOSIS — Z1212 Encounter for screening for malignant neoplasm of rectum: Secondary | ICD-10-CM | POA: Diagnosis not present

## 2020-11-13 ENCOUNTER — Ambulatory Visit (INDEPENDENT_AMBULATORY_CARE_PROVIDER_SITE_OTHER): Payer: PPO | Admitting: Orthopedic Surgery

## 2020-11-13 ENCOUNTER — Ambulatory Visit (INDEPENDENT_AMBULATORY_CARE_PROVIDER_SITE_OTHER): Payer: PPO

## 2020-11-13 ENCOUNTER — Other Ambulatory Visit: Payer: Self-pay

## 2020-11-13 DIAGNOSIS — M5440 Lumbago with sciatica, unspecified side: Secondary | ICD-10-CM

## 2020-11-14 ENCOUNTER — Telehealth: Payer: Self-pay | Admitting: Orthopedic Surgery

## 2020-11-14 ENCOUNTER — Other Ambulatory Visit: Payer: Self-pay | Admitting: Orthopedic Surgery

## 2020-11-14 ENCOUNTER — Encounter: Payer: Self-pay | Admitting: Physician Assistant

## 2020-11-14 MED ORDER — PREDNISONE 10 MG PO TABS: 10.0000 mg | ORAL_TABLET | Freq: Every day | ORAL | 1 refills | Status: DC

## 2020-11-14 NOTE — Progress Notes (Signed)
Office Visit Note   Patient: Luis Boone           Date of Birth: [birth year]           MRN: [id number] Visit Date: 11/13/2020              Requested by: Prince Solian, MD [address] [place],  [place] [address] PCP: Prince Solian, MD  Chief Complaint  Patient presents with  . Right Leg - Pain      HPI: Patient is a 70 year old gentleman who complains of pain radiating from the right thigh to the right ankle he states this started after doing some leg stretches several weeks ago.  Patient is currently ambulating with crutches.  Assessment & Plan: Visit Diagnoses:  1. Acute right-sided low back pain with sciatica, sciatica laterality unspecified     Plan: We will start low-dose prednisone reevaluate in 3 weeks.  Follow-Up Instructions: Return in about 3 weeks (around 12/04/2020).   Ortho Exam  Patient is alert, oriented, no adenopathy, well-dressed, normal affect, normal respiratory effort. Examination patient has no pain with range of motion of the hip knee or ankle.  There is no tenderness to palpation over the medial or lateral joint line of the knee.  Patient does have pain reproduced with a straight leg raise on the right there is no focal motor weakness.  Imaging: No results found. No images are attached to the encounter.  Labs: No results found for: HGBA1C, ESRSEDRATE, CRP, LABURIC, REPTSTATUS, GRAMSTAIN, CULT, LABORGA   Lab Results  Component Value Date   ALBUMIN 4.2 05/01/2019    No results found for: MG No results found for: VD25OH  No results found for: PREALBUMIN CBC EXTENDED Latest Ref Rng & Units 05/01/2019 01/01/2018  WBC 4.0 - 10.5 K/uL 7.4 7.0  RBC 4.22 - 5.81 MIL/uL 4.37 4.43  HGB 13.0 - 17.0 g/dL 13.9 13.5  HCT 39.0 - 52.0 % 43.1 41.2  PLT 150 - 400 K/uL 177 163     There is no height or weight on file to calculate BMI.  Orders:  Orders Placed This Encounter  Procedures  . XR Lumbar Spine 2-3 Views   No orders of the defined  types were placed in this encounter.    Procedures: No procedures performed  Clinical Data: No additional findings.  ROS:  All other systems negative, except as noted in the HPI. Review of Systems  Objective: Vital Signs: There were no vitals taken for this visit.  Specialty Comments:  No specialty comments available.  PMFS History: Patient Active Problem List   Diagnosis Date Noted  . PUD (peptic ulcer disease)   . Mitral murmur   . Hypertension   . Effusion, left knee 12/05/2018   Past Medical History:  Diagnosis Date  . Allergy    seasonal  . Arthritis   . Depression   . GERD (gastroesophageal reflux disease)   . Gout   . Heart murmur    heart valve repair  . Hiatal hernia   . HLD (hyperlipidemia)   . Hypertension   . Mitral murmur   . Obesity   . Ocular herpes simplex   . PUD (peptic ulcer disease)   . Sleep apnea    use CPAP    Family History  Problem Relation Age of Onset  . Migraines Mother   . Hypertension Mother   . Dementia Mother   . Cancer Father   . Arthritis Father   . Heart attack Father   .  Stomach cancer Paternal Grandmother   . Colon cancer Neg Hx   . Colon polyps Neg Hx   . Esophageal cancer Neg Hx   . Rectal cancer Neg Hx     Past Surgical History:  Procedure Laterality Date  . COLONOSCOPY  2005,10/02/2014  . KNEE ARTHROSCOPY Right 2006  . KNEE SURGERY  10/2018   meniscus repair  . MITRAL VALVE REPAIR  2004  . POLYPECTOMY     Social History   Occupational History  . Not on file  Tobacco Use  . Smoking status: Never Smoker  . Smokeless tobacco: Never Used  Vaping Use  . Vaping Use: Never used  Substance and Sexual Activity  . Alcohol use: No    Alcohol/week: 0.0 standard drinks  . Drug use: No  . Sexual activity: Not on file

## 2020-11-14 NOTE — Telephone Encounter (Signed)
Pt was in the office yesterday for LBP. The note has not been dictated yet but the pt is asking for an rx for prednisone. Please advise.

## 2020-11-14 NOTE — Telephone Encounter (Signed)
Pt would like for you call him prescription for prednisone!

## 2020-11-14 NOTE — Telephone Encounter (Signed)
Rx sent 

## 2020-11-18 DIAGNOSIS — H1789 Other corneal scars and opacities: Secondary | ICD-10-CM | POA: Diagnosis not present

## 2020-11-18 DIAGNOSIS — B0052 Herpesviral keratitis: Secondary | ICD-10-CM | POA: Diagnosis not present

## 2020-12-04 ENCOUNTER — Other Ambulatory Visit: Payer: Self-pay

## 2020-12-04 ENCOUNTER — Encounter: Payer: Self-pay | Admitting: Orthopedic Surgery

## 2020-12-04 ENCOUNTER — Ambulatory Visit: Payer: PPO | Admitting: Physician Assistant

## 2020-12-04 DIAGNOSIS — M5440 Lumbago with sciatica, unspecified side: Secondary | ICD-10-CM | POA: Diagnosis not present

## 2020-12-04 NOTE — Progress Notes (Signed)
Office Visit Note   Patient: Luis Boone           Date of Birth: Jun 02, 1951           MRN: [id number] Visit Date: 12/04/2020              Requested by: Prince Solian, MD [address] [place],  [place] [address] PCP: Prince Solian, MD  Chief Complaint  Patient presents with  . Lower Back - Pain, Follow-up      HPI: Patient presents today for follow-up on his radicular back pain.  He last saw Dr. Sharol Given about 3 weeks ago at which time he had back pain radiating down his right thigh into his leg.  He was started on a course of prednisone which she says is helped him significantly.  He still has some tightness in his thigh but he states he is 90 to 95% better  Assessment & Plan: Visit Diagnoses: No diagnosis found.  Plan: He may continue with the prednisone for another week.  That point if he is doing well I asked him that he should start to wean off of the prednisone and explained to him the best way to do that.  We also talked about physical therapy and conditioning as he is trying to get back to walking and golf which he enjoys.  He does have access to a trainer and is going to work with them however if he has any concerns I have offered him physical therapy.  Follow-up as needed.  He also has left knee issues and mentions this in passing.  He was due to have a left knee replacement right before the Covid shutdown.  While he does not want to have a knee replacement right now I have told him that if he had some symptoms he certainly could come in for an injection  Follow-Up Instructions: No follow-ups on file.   Ortho Exam  Patient is alert, oriented, no adenopathy, well-dressed, normal affect, normal respiratory effort. Lamination of his lower back.  He has a negative straight leg raise could strength with dorsiflexion plantarflexion 5/5.  He has no muscle atrophy on the right lower extremity mild tightness in the anterior thigh but no real pain no paresthesias  Imaging: No  results found. No images are attached to the encounter.  Labs: No results found for: HGBA1C, ESRSEDRATE, CRP, LABURIC, REPTSTATUS, GRAMSTAIN, CULT, LABORGA   Lab Results  Component Value Date   ALBUMIN 4.2 05/01/2019    No results found for: MG No results found for: VD25OH  No results found for: PREALBUMIN CBC EXTENDED Latest Ref Rng & Units 05/01/2019 01/01/2018  WBC 4.0 - 10.5 K/uL 7.4 7.0  RBC 4.22 - 5.81 MIL/uL 4.37 4.43  HGB 13.0 - 17.0 g/dL 13.9 13.5  HCT 39.0 - 52.0 % 43.1 41.2  PLT 150 - 400 K/uL 177 163     There is no height or weight on file to calculate BMI.  Orders:  No orders of the defined types were placed in this encounter.  No orders of the defined types were placed in this encounter.    Procedures: No procedures performed  Clinical Data: No additional findings.  ROS:  All other systems negative, except as noted in the HPI. Review of Systems  Objective: Vital Signs: There were no vitals taken for this visit.  Specialty Comments:  No specialty comments available.  PMFS History: Patient Active Problem List   Diagnosis Date Noted  . PUD (peptic  ulcer disease)   . Mitral murmur   . Hypertension   . Effusion, left knee 12/05/2018   Past Medical History:  Diagnosis Date  . Allergy    seasonal  . Arthritis   . Depression   . GERD (gastroesophageal reflux disease)   . Gout   . Heart murmur    heart valve repair  . Hiatal hernia   . HLD (hyperlipidemia)   . Hypertension   . Mitral murmur   . Obesity   . Ocular herpes simplex   . PUD (peptic ulcer disease)   . Sleep apnea    use CPAP    Family History  Problem Relation Age of Onset  . Migraines Mother   . Hypertension Mother   . Dementia Mother   . Cancer Father   . Arthritis Father   . Heart attack Father   . Stomach cancer Paternal Grandmother   . Colon cancer Neg Hx   . Colon polyps Neg Hx   . Esophageal cancer Neg Hx   . Rectal cancer Neg Hx     Past Surgical  History:  Procedure Laterality Date  . COLONOSCOPY  2005,10/02/2014  . KNEE ARTHROSCOPY Right 2006  . KNEE SURGERY  10/2018   meniscus repair  . MITRAL VALVE REPAIR  2004  . POLYPECTOMY     Social History   Occupational History  . Not on file  Tobacco Use  . Smoking status: Never Smoker  . Smokeless tobacco: Never Used  Vaping Use  . Vaping Use: Never used  Substance and Sexual Activity  . Alcohol use: No    Alcohol/week: 0.0 standard drinks  . Drug use: No  . Sexual activity: Not on file

## 2020-12-15 DIAGNOSIS — G4733 Obstructive sleep apnea (adult) (pediatric): Secondary | ICD-10-CM | POA: Diagnosis not present

## 2021-01-19 DIAGNOSIS — B0052 Herpesviral keratitis: Secondary | ICD-10-CM | POA: Diagnosis not present

## 2021-01-19 DIAGNOSIS — H40052 Ocular hypertension, left eye: Secondary | ICD-10-CM | POA: Diagnosis not present

## 2021-02-18 DIAGNOSIS — B0052 Herpesviral keratitis: Secondary | ICD-10-CM | POA: Diagnosis not present

## 2021-02-18 DIAGNOSIS — H40042 Steroid responder, left eye: Secondary | ICD-10-CM | POA: Diagnosis not present

## 2021-03-03 DIAGNOSIS — B0050 Herpesviral ocular disease, unspecified: Secondary | ICD-10-CM | POA: Diagnosis not present

## 2021-03-03 DIAGNOSIS — D126 Benign neoplasm of colon, unspecified: Secondary | ICD-10-CM | POA: Diagnosis not present

## 2021-03-03 DIAGNOSIS — L853 Xerosis cutis: Secondary | ICD-10-CM | POA: Diagnosis not present

## 2021-03-03 DIAGNOSIS — G4733 Obstructive sleep apnea (adult) (pediatric): Secondary | ICD-10-CM | POA: Diagnosis not present

## 2021-03-03 DIAGNOSIS — N1831 Chronic kidney disease, stage 3a: Secondary | ICD-10-CM | POA: Diagnosis not present

## 2021-03-03 DIAGNOSIS — Z952 Presence of prosthetic heart valve: Secondary | ICD-10-CM | POA: Diagnosis not present

## 2021-03-03 DIAGNOSIS — J309 Allergic rhinitis, unspecified: Secondary | ICD-10-CM | POA: Diagnosis not present

## 2021-03-03 DIAGNOSIS — M1990 Unspecified osteoarthritis, unspecified site: Secondary | ICD-10-CM | POA: Diagnosis not present

## 2021-03-03 DIAGNOSIS — I129 Hypertensive chronic kidney disease with stage 1 through stage 4 chronic kidney disease, or unspecified chronic kidney disease: Secondary | ICD-10-CM | POA: Diagnosis not present

## 2021-03-03 DIAGNOSIS — M109 Gout, unspecified: Secondary | ICD-10-CM | POA: Diagnosis not present

## 2021-03-03 DIAGNOSIS — E785 Hyperlipidemia, unspecified: Secondary | ICD-10-CM | POA: Diagnosis not present

## 2021-03-16 DIAGNOSIS — G4733 Obstructive sleep apnea (adult) (pediatric): Secondary | ICD-10-CM | POA: Diagnosis not present

## 2021-03-18 DIAGNOSIS — B0052 Herpesviral keratitis: Secondary | ICD-10-CM | POA: Diagnosis not present

## 2021-03-18 DIAGNOSIS — H4052X1 Glaucoma secondary to other eye disorders, left eye, mild stage: Secondary | ICD-10-CM | POA: Diagnosis not present

## 2021-04-03 DIAGNOSIS — B0052 Herpesviral keratitis: Secondary | ICD-10-CM | POA: Diagnosis not present

## 2021-04-08 ENCOUNTER — Other Ambulatory Visit: Payer: Self-pay

## 2021-04-08 ENCOUNTER — Encounter: Payer: Self-pay | Admitting: Cardiology

## 2021-04-08 ENCOUNTER — Ambulatory Visit: Payer: PPO | Admitting: Cardiology

## 2021-04-08 VITALS — BP 118/60 | HR 60 | Temp 98.7°F | Resp 16 | Ht 68.0 in | Wt 235.0 lb

## 2021-04-08 DIAGNOSIS — Z9889 Other specified postprocedural states: Secondary | ICD-10-CM | POA: Diagnosis not present

## 2021-04-08 DIAGNOSIS — Z6837 Body mass index (BMI) 37.0-37.9, adult: Secondary | ICD-10-CM | POA: Diagnosis not present

## 2021-04-08 DIAGNOSIS — E6609 Other obesity due to excess calories: Secondary | ICD-10-CM | POA: Diagnosis not present

## 2021-04-08 DIAGNOSIS — I10 Essential (primary) hypertension: Secondary | ICD-10-CM

## 2021-04-08 DIAGNOSIS — E7800 Pure hypercholesterolemia, unspecified: Secondary | ICD-10-CM

## 2021-04-08 DIAGNOSIS — R079 Chest pain, unspecified: Secondary | ICD-10-CM | POA: Diagnosis not present

## 2021-04-08 NOTE — Progress Notes (Signed)
Primary Physician/Referring:  Prince Solian, MD  Patient ID: Luis Boone, male    DOB: 11-Sep-1951, 70 y.o.   MRN: [id number]  No chief complaint on file.  HPI:    Luis Boone  is a 70 y.o. male with history of mitral valve repair due to flail mitral valve in 2004, hypertension, stage III chronic kidney disease, family history of premature coronary artery disease with father having had coronary disease at age 39, moderate obesity, degenerative joint disease and severe sleep apnea in Apr 2018 and follows Dr. Rexene Alberts. 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 Patient presents today for cardiac checkup at the urging of his PCP. Pt does not report any acute symptoms or concerns. Patient was last seen by this office on 11/02/2019 for atypical chest pain that occurred with going up stairs and with rest. Cardiac work up showed no acute cause for the chest pain at the time. Patient was lost to follow up afterwards. The patient states that his chest pain from 11/02/2019 resolved itself. He states that he believed it to be related to stress from [place] and politics. He reports no recurrence of the chest pain. He states that he has been doing well from a cardiac standpoint. He has been struggling recently with sciatica, neck pain, and left eye problems related to increased pressure in the eye and cataracts. He states that most of his pain stems from an accident he had in 2020 where he hit his head on a cinder block. The patient reports around a 30 pound weight loss today due to changing his diet to include more fruits and vegetables and less fried foods. Patient has had to decrease his participation in golfing due to his vision and sciatica. Otherwise, he reports no decrease in his normal daily activities and he is able to walk, garden, and perform house chores.   The patient has been monitoring his BP at home. His average home readings range from 106-120/60-75. His BP in the office today is 118/60.    Patient denies SOB, syncope, dizziness, palpitations, and falls. He does endorse fatigue related to problems with his CPAP.     Past Medical History:  Diagnosis Date   Allergy    seasonal   Arthritis    Depression    GERD (gastroesophageal reflux disease)    Gout    Heart murmur    heart valve repair   Hiatal hernia    HLD (hyperlipidemia)    Hypertension    Mitral murmur    Obesity    Ocular herpes simplex    PUD (peptic ulcer disease)    Sleep apnea    use CPAP   Past Surgical History:  Procedure Laterality Date   COLONOSCOPY  2005,10/02/2014   KNEE ARTHROSCOPY Right 2006   KNEE SURGERY  10/2018   meniscus repair   MITRAL VALVE REPAIR  2004   POLYPECTOMY     Family History  Problem Relation Age of Onset   Migraines Mother    Hypertension Mother    Dementia Mother    Cancer Father    Arthritis Father    Heart attack Father    Stomach cancer Paternal Grandmother    Colon cancer Neg Hx    Colon polyps Neg Hx    Esophageal cancer Neg Hx    Rectal cancer Neg Hx     Social History   Tobacco Use   Smoking status: Never   Smokeless tobacco: Never  Substance Use  Topics   Alcohol use: No    Alcohol/week: 0.0 standard drinks   Marital Status: Married  ROS  Review of Systems  Constitutional: Positive for malaise/fatigue (Related to obstructive sleep apnea).  Cardiovascular:  Negative for chest pain, claudication, dyspnea on exertion, leg swelling, near-syncope, palpitations and syncope.  Respiratory:  Negative for shortness of breath.   Musculoskeletal:  Positive for back pain (being evaluated for sciatica) and neck pain (since fall and head injury in April 2019).  Neurological:  Positive for headaches (since fall and head injury in April 2019). Negative for dizziness.  Objective  There were no vitals taken for this visit. There is no height or weight on file to calculate BMI.  Vitals with BMI 08/11/2020 11/02/2019 08/09/2019  Height '5\' 8"'  '5\' 9"'  5' 7.5"   Weight 248 lbs 252 lbs 255 lbs 3 oz  BMI 37.72 20.2 33.43  Systolic 568 616 837  Diastolic 91 75 70  Pulse 77 60 72     Physical Exam Constitutional:      Appearance: He is obese.  Cardiovascular:     Rate and Rhythm: Normal rate and regular rhythm.     Pulses: Normal pulses.     Heart sounds: Normal heart sounds. No murmur heard.   No gallop.  Abdominal:     General: Bowel sounds are normal. There is no abdominal bruit.     Palpations: Abdomen is soft.     Tenderness: There is no abdominal tenderness.  Musculoskeletal:     Right lower leg: No edema.     Left lower leg: No edema.  Neurological:     Mental Status: He is alert.     Laboratory examination:   No results for input(s): NA, K, CL, CO2, GLUCOSE, BUN, CREATININE, CALCIUM, GFRNONAA, GFRAA in the last 8760 hours. CrCl cannot be calculated (Patient's most recent lab result is older than the maximum 21 days allowed.).  CMP Latest Ref Rng & Units 05/01/2019 01/01/2018  Glucose 70 - 99 mg/dL 111(H) 114(H)  BUN 8 - 23 mg/dL 14 16  Creatinine 0.61 - 1.24 mg/dL 1.45(H) 1.49(H)  Sodium 135 - 145 mmol/L 142 141  Potassium 3.5 - 5.1 mmol/L 4.7 4.1  Chloride 98 - 111 mmol/L 109 111  CO2 22 - 32 mmol/L 24 22  Calcium 8.9 - 10.3 mg/dL 9.7 9.0  Total Protein 6.5 - 8.1 g/dL 7.0 -  Total Bilirubin 0.3 - 1.2 mg/dL 0.8 -  Alkaline Phos 38 - 126 U/L 74 -  AST 15 - 41 U/L 21 -  ALT 0 - 44 U/L 19 -   CBC Latest Ref Rng & Units 05/01/2019 01/01/2018  WBC 4.0 - 10.5 K/uL 7.4 7.0  Hemoglobin 13.0 - 17.0 g/dL 13.9 13.5  Hematocrit 39.0 - 52.0 % 43.1 41.2  Platelets 150 - 400 K/uL 177 163    Lipid Panel No results for input(s): CHOL, TRIG, LDLCALC, VLDL, HDL, CHOLHDL, LDLDIRECT in the last 8760 hours. Lipid Panel  No results found for: CHOL, TRIG, HDL, CHOLHDL, VLDL, LDLCALC, LDLDIRECT, LABVLDL   HEMOGLOBIN A1C No results found for: HGBA1C, MPG TSH No results for input(s): TSH in the last 8760 hours.  External labs:   Labs  10/17/2020:  Hb 14.0/HCT 43.1, platelets 160.  Serum glucose 92 mg, BUN 15, creatinine 1.34, EGFR 53 mL, potassium 4.3, CMP otherwise normal.  Total cholesterol 121, triglycerides 86, HDL 46, LDL 58.  Apolipoprotein B normal at 56.  TSH normal at 2.450, uric acid normal at  5.6. Medications and allergies   Allergies  Allergen Reactions   Prilosec [Omeprazole] Rash   Vicodin [Hydrocodone-Acetaminophen] Rash   Other Rash    Medication to sedate him for a colonoscopy. 10-02-2014 propofol   Tramadol Rash     Medication prior to this encounter:   Outpatient Medications Prior to Visit  Medication Sig Dispense Refill   acetaminophen (TYLENOL) 500 MG tablet Take 500 mg by mouth every 6 (six) hours as needed.     allopurinol (ZYLOPRIM) 300 MG tablet Take 300 mg by mouth daily.      aspirin 81 MG tablet Take 81 mg by mouth daily.     atorvastatin (LIPITOR) 40 MG tablet Take 40 mg by mouth daily.     famotidine (PEPCID) 40 MG tablet Take 40 mg by mouth 2 (two) times daily.     Melatonin 10 MG CAPS Take 10 mg by mouth at bedtime.      metoprolol succinate (TOPROL-XL) 25 MG 24 hr tablet Take 25 mg by mouth daily.     Polyethyl Glycol-Propyl Glycol (SYSTANE) 0.4-0.3 % GEL ophthalmic gel Place 1 application into the left eye every evening.     predniSONE (DELTASONE) 10 MG tablet Take 1 tablet (10 mg total) by mouth daily with breakfast. 30 tablet 1   Respiratory Therapy Supplies (CARETOUCH 2 CPAP HOSE HANGER) MISC by Does not apply route.     triamcinolone ointment (KENALOG) 0.1 % Apply topically.     No facility-administered medications prior to visit.     Medication list after today's encounter   Current Outpatient Medications  Medication Instructions   acetaminophen (TYLENOL) 500 mg, Oral, Every 6 hours PRN   allopurinol (ZYLOPRIM) 300 mg, Oral, Daily   aspirin 81 mg, Oral, Daily   atorvastatin (LIPITOR) 40 mg, Oral, Daily   famotidine (PEPCID) 40 mg, Oral, 2 times daily   Melatonin  10 mg, Oral, Daily at bedtime   metoprolol succinate (TOPROL-XL) 25 mg, Oral, Daily   Polyethyl Glycol-Propyl Glycol (SYSTANE) 0.4-0.3 % GEL ophthalmic gel 1 application, Left Eye, Every evening   predniSONE (DELTASONE) 10 mg, Oral, Daily with breakfast   Respiratory Therapy Supplies (CARETOUCH 2 CPAP HOSE HANGER) MISC Does not apply   triamcinolone ointment (KENALOG) 0.1 % Topical    Radiology:   No results found.  Cardiac Studies:   Lexiscan Myoview stress test 06/25/2019: Lexiscan stress test was performed. Stress EKG is non-diagnostic, as this is pharmacological stress test. SPECT stress and rest images demonstrate medium sized area of mildly decreased uptake in inferior myocardium, more prominent on rest images. With normal wall thickening and wall motion, this area likely represents tissue attenuation. Stress LV EF is normal 67%. Low risk study.   Echocardiogram 05/25/2019: Left ventricle cavity is normal in size. Normal left ventricular wall thickness. Abnormal septal wall motion due to post-operative valve. Normal LV systolic function with EF 55%. Diastolic function not assessed due to h/o mitral valve repair. Trileaflet aortic valve. Mild (Grade I) aortic regurgitation. S/p mitral valve repair. Mild mitral stenosis. Mean PG 2 mmHg at HR 59 bpm. MVA 1.6 cm2 by pressure half-time method. Mild (Grade I) mitral regurgitation. Mild tricuspid regurgitation. No evidence of pulmonary hypertension. Compared to previous study in 2015, mild mitral stenosis is new.   Treadmill stress test  [03/11/2014]: Indications: Screening for CAD. Conclusions: Normal Test. Negative for ischemia, The baseline ECG showed NSR,Normal ECG. During exercise there was no ST-T changes of ischemia. The patient exercised according to the Bruce protocol, Total  time recorded 5 Min. 24 sec. achieving a max heart rate of 157 which was 100% of MPHR for age and 7.3 METS of work. Symptoms: THR achieved.. Arrhythmia:  Occasional PVC at rest. continue primary prevention. Baseline NIBP was 132/82. Peak NIBP was 132/82 MaxSysp was: 178 MaxDiasp was: 82.   Coronary Angiogram  [2004]: no stents placed; done at [facility]  EKG:   EKG 04/08/2021: Normal sinus rhythm at rate of 56 bpm, normal axis.  No evidence of ischemia, normal EKG.  EKG 11/02/2019: Normal sinus rhythm at 63 bpm with 1 PAC, normal axis, no evidence of ischemia. Low voltage complexes.  Assessment  No diagnosis found.   There are no discontinued medications.  No orders of the defined types were placed in this encounter.  No orders of the defined types were placed in this encounter.  Recommendations:   Luis Boone is a 70 y.o. caucasian male with history of mitral valve repair due to flail mitral valve in 2004, hypertension, stage III chronic kidney disease, family history of premature coronary artery disease with father having had coronary disease at age 84, moderate obesity, degenerative joint disease and severe sleep apnea in Apr 2018 and follows Dr. Rexene Alberts. 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  Patient is doing very well today from a cardiac standpoint. His lipids and BP are well controlled and his EKG is normal. He reports no acute symptoms. Patient has been losing weight by dieting so further weight loss was encouraged.   No orders placed today and no mediations were added or changed. Patient should continue current medications of Atorvastatin 40 mg, Metoprolol succinate 56m, and aspirin.   Patient may follow up in one year for check up of mitral valve repair, HTN, and history of chest pain.     [place] PA-S 04/08/2021, 1:03 PM Office: [phone]

## 2021-04-21 DIAGNOSIS — B0052 Herpesviral keratitis: Secondary | ICD-10-CM | POA: Diagnosis not present

## 2021-04-24 DIAGNOSIS — B0052 Herpesviral keratitis: Secondary | ICD-10-CM | POA: Diagnosis not present

## 2021-05-01 DIAGNOSIS — B0052 Herpesviral keratitis: Secondary | ICD-10-CM | POA: Diagnosis not present

## 2021-05-05 DIAGNOSIS — B0052 Herpesviral keratitis: Secondary | ICD-10-CM | POA: Diagnosis not present

## 2021-05-05 DIAGNOSIS — H00015 Hordeolum externum left lower eyelid: Secondary | ICD-10-CM | POA: Diagnosis not present

## 2021-05-08 DIAGNOSIS — B0052 Herpesviral keratitis: Secondary | ICD-10-CM | POA: Diagnosis not present

## 2021-05-08 DIAGNOSIS — H0015 Chalazion left lower eyelid: Secondary | ICD-10-CM | POA: Diagnosis not present

## 2021-05-11 DIAGNOSIS — B0052 Herpesviral keratitis: Secondary | ICD-10-CM | POA: Diagnosis not present

## 2021-05-15 DIAGNOSIS — H4052X4 Glaucoma secondary to other eye disorders, left eye, indeterminate stage: Secondary | ICD-10-CM | POA: Diagnosis not present

## 2021-05-15 DIAGNOSIS — B0052 Herpesviral keratitis: Secondary | ICD-10-CM | POA: Diagnosis not present

## 2021-05-20 DIAGNOSIS — B0052 Herpesviral keratitis: Secondary | ICD-10-CM | POA: Diagnosis not present

## 2021-06-04 DIAGNOSIS — H40052 Ocular hypertension, left eye: Secondary | ICD-10-CM | POA: Diagnosis not present

## 2021-06-04 DIAGNOSIS — B0052 Herpesviral keratitis: Secondary | ICD-10-CM | POA: Diagnosis not present

## 2021-06-15 DIAGNOSIS — G4733 Obstructive sleep apnea (adult) (pediatric): Secondary | ICD-10-CM | POA: Diagnosis not present

## 2021-07-03 DIAGNOSIS — B0052 Herpesviral keratitis: Secondary | ICD-10-CM | POA: Diagnosis not present

## 2021-07-03 DIAGNOSIS — H40052 Ocular hypertension, left eye: Secondary | ICD-10-CM | POA: Diagnosis not present

## 2021-08-10 ENCOUNTER — Ambulatory Visit: Payer: PPO | Admitting: Adult Health

## 2021-08-10 ENCOUNTER — Encounter: Payer: Self-pay | Admitting: Adult Health

## 2021-08-10 ENCOUNTER — Other Ambulatory Visit: Payer: Self-pay

## 2021-08-10 VITALS — BP 119/75 | HR 97 | Ht 68.0 in | Wt 240.0 lb

## 2021-08-10 DIAGNOSIS — Z9989 Dependence on other enabling machines and devices: Secondary | ICD-10-CM

## 2021-08-10 DIAGNOSIS — G4733 Obstructive sleep apnea (adult) (pediatric): Secondary | ICD-10-CM

## 2021-08-10 NOTE — Progress Notes (Signed)
PATIENT: Luis Boone DOB: 05-May-1951  REASON FOR VISIT: follow up HISTORY FROM: patient   HISTORY OF PRESENT ILLNESS: Today 08/10/21 Luis Boone is a 70 y.o. male with a a history of OSA on CPAP. He returns today for a follow up. His download is attached in a separate note and demonstrates excellent compliance of 100% for days worn and 97% for greater than 4 hours. He has a set pressure setting of 15 cmH2O with an AHI of 6. Patient reports that he wakes up frequently with a dry mouth, encouraged patient to increase the humidity on his machine. He was also told via his PCP to obtain a humidifier for his house since he has gas heating. This may also provide benefit. He complains of his straps wearing out too quickly and that his insurance will only cover a replacement every 6 months. I completed some research with the patient and found his straps on [employer] and encouraged him to try those before buying them full price through the DME company.   HISTORY  He saw Luis Boone, nurse practitioner on 08/09/2019, at which time he was compliant with his CPAP.  He was advised to follow-up in 1 year.   Today, 08/11/2020: I reviewed his CPAP compliance data from 07/08/2020 through 08/06/2020, which is a total of 30 days, during which time he used his machine 29 days with percent use days greater than 4 hours at 70%, indicating adequate compliance with an average usage of 5 hours and 41 minutes, residual AHI borderline at 4.5/h, central AHI 1.6/h, pressure of 15 cm, leak on the higher side with a 95th percentile at 23.2 L/min.  He reports overall doing well with his CPAP but sometimes the mask does not stay in place, the headgear stretches out within less than 6 months and he can only get 1 every 6 months per insurance.  He recently paid out of pocket for headgear to replace it.  Sometimes he does not get enough usage when he has to get up early to take his [place] to the airport.  He has 36 sons, 42 year old got  married recently.  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  Patient reports residual left knee pain.  He had arthroscopic surgery and February 2020.  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  He will eventually need bilateral cataract repairs.  Headaches are not significant currently, he does not take Tylenol on a daily or regular basis any longer.  He had chest pain and went to the ER for this last year.  Sadly, he lost his mother in December 2020.  He lost his father-in-law in December 2019.  REVIEW OF SYSTEMS: Out of a complete 14 system review of symptoms, the patient complains only of the following symptoms, and all other reviewed systems are negative.  FSS ESS 3  ALLERGIES: Allergies  Allergen Reactions   Prilosec [Omeprazole] Rash   Vicodin [Hydrocodone-Acetaminophen] Rash   Other Rash    Medication to sedate him for a colonoscopy. 10-02-2014 propofol   Tramadol Rash    HOME MEDICATIONS: Outpatient Medications Prior to Visit  Medication Sig Dispense Refill   acetaminophen (TYLENOL) 500 MG tablet Take 500 mg by mouth every 6 (six) hours as needed.     allopurinol (ZYLOPRIM) 300 MG tablet Take 300 mg by mouth daily.  aspirin 81 MG tablet Take 81 mg by mouth daily.     atorvastatin (LIPITOR) 40 MG tablet Take 40 mg by mouth daily.     famotidine (PEPCID) 40 MG tablet Take 40 mg by mouth 2 (two) times daily.     loteprednol (LOTEMAX) 0.5 % ophthalmic suspension Place 2 drops into both eyes 4 (four) times daily.     Melatonin 10 MG CAPS Take 10 mg by mouth at bedtime.      metoprolol succinate (TOPROL-XL) 25 MG 24 hr tablet Take 25 mg by mouth daily.     montelukast (SINGULAIR) 10 MG tablet Take 10 mg by mouth at bedtime.     Polyethyl Glycol-Propyl Glycol (SYSTANE) 0.4-0.3 % GEL ophthalmic gel Place 1 application into the left eye  every evening.     Respiratory Therapy Supplies (CARETOUCH 2 CPAP HOSE HANGER) MISC by Does not apply route.     timolol (TIMOPTIC) 0.5 % ophthalmic solution Place 1 drop into both eyes 2 (two) times daily.     triamcinolone ointment (KENALOG) 0.1 % Apply topically.     valACYclovir (VALTREX) 1000 MG tablet as needed.     No facility-administered medications prior to visit.    PAST MEDICAL HISTORY: Past Medical History:  Diagnosis Date   Allergy    seasonal   Arthritis    Depression    GERD (gastroesophageal reflux disease)    Gout    Heart murmur    heart valve repair   Hiatal hernia    HLD (hyperlipidemia)    Hypertension    Mitral murmur    Obesity    Ocular herpes simplex    PUD (peptic ulcer disease)    Sleep apnea    use CPAP    PAST SURGICAL HISTORY: Past Surgical History:  Procedure Laterality Date   COLONOSCOPY  2005,10/02/2014   KNEE ARTHROSCOPY Right 2006   KNEE SURGERY  10/2018   meniscus repair   MITRAL VALVE REPAIR  2004   POLYPECTOMY      FAMILY HISTORY: Family History  Problem Relation Age of Onset   Migraines Mother    Hypertension Mother    Dementia Mother    Cancer Father    Arthritis Father    Heart attack Father    Stomach cancer Paternal Grandmother    Colon cancer Neg Hx    Colon polyps Neg Hx    Esophageal cancer Neg Hx    Rectal cancer Neg Hx     SOCIAL HISTORY: Social History   Socioeconomic History   Marital status: Married    Spouse name: Not on file   Number of children: 2   Years of education: Not on file   Highest education level: Not on file  Occupational History   Not on file  Tobacco Use   Smoking status: Never   Smokeless tobacco: Never  Vaping Use   Vaping Use: Never used  Substance and Sexual Activity   Alcohol use: No    Alcohol/week: 0.0 standard drinks   Drug use: No   Sexual activity: Not on file  Other Topics Concern   Not on file  Social History Narrative   Not on file   Social Determinants  of Health   Financial Resource Strain: Not on file  Food Insecurity: Not on file  Transportation Needs: Not on file  Physical Activity: Not on file  Stress: Not on file  Social Connections: Not on file  Intimate Partner Violence: Not on file  PHYSICAL EXAM  Vitals:   08/10/21 1137  BP: 119/75  Pulse: 97  Weight: 240 lb (108.9 kg)  Height: 5\' 8"  (1.727 m)   Body mass index is 36.49 kg/m.  Generalized: Well developed, in no acute distress  Chest: Lungs clear to auscultation bilaterally  Neurological examination  Mentation: Alert oriented to time, place, history taking. Follows all commands speech and language fluent Cranial nerve II-XII: Extraocular movements were full, visual field were full on confrontational test Head turning and shoulder shrug  were normal and symmetric. Motor: The motor testing reveals 5 over 5 strength of all 4 extremities. Good symmetric motor tone is noted throughout.  Sensory: Sensory testing is intact to soft touch on all 4 extremities. No evidence of extinction is noted.  Gait and station: Gait is normal.    DIAGNOSTIC DATA (LABS, IMAGING, TESTING) - I reviewed patient records, labs, notes, testing and imaging myself where available.  Lab Results  Component Value Date   WBC 7.4 05/01/2019   HGB 13.9 05/01/2019   HCT 43.1 05/01/2019   MCV 98.6 05/01/2019   PLT 177 05/01/2019      Component Value Date/Time   NA 142 05/01/2019 1246   K 4.7 05/01/2019 1246   CL 109 05/01/2019 1246   CO2 24 05/01/2019 1246   GLUCOSE 111 (H) 05/01/2019 1246   BUN 14 05/01/2019 1246   CREATININE 1.45 (H) 05/01/2019 1246   CALCIUM 9.7 05/01/2019 1246   PROT 7.0 05/01/2019 1323   ALBUMIN 4.2 05/01/2019 1323   AST 21 05/01/2019 1323   ALT 19 05/01/2019 1323   ALKPHOS 74 05/01/2019 1323   BILITOT 0.8 05/01/2019 1323   GFRNONAA 49 (L) 05/01/2019 1246   GFRAA 57 (L) 05/01/2019 1246      ASSESSMENT AND PLAN 70 y.o. year old male  has a past medical  history of Allergy, Arthritis, Depression, GERD (gastroesophageal reflux disease), Gout, Heart murmur, Hiatal hernia, HLD (hyperlipidemia), Hypertension, Mitral murmur, Obesity, Ocular herpes simplex, PUD (peptic ulcer disease), and Sleep apnea. here with:  OSA on CPAP  - CPAP compliance excellent - Good treatment of AHI  - Encourage patient to use CPAP nightly and > 4 hours each night - F/U in 1 year or sooner if needed    Luis Givens, MSN, NP-C 08/10/2021, 11:34 AM [facility] [address] [place], [place] [address] [phone]

## 2021-08-18 ENCOUNTER — Other Ambulatory Visit: Payer: Self-pay

## 2021-08-18 ENCOUNTER — Ambulatory Visit (INDEPENDENT_AMBULATORY_CARE_PROVIDER_SITE_OTHER): Payer: PPO

## 2021-08-18 ENCOUNTER — Ambulatory Visit: Payer: PPO | Admitting: Family

## 2021-08-18 DIAGNOSIS — M5440 Lumbago with sciatica, unspecified side: Secondary | ICD-10-CM

## 2021-08-18 MED ORDER — PREDNISONE 50 MG PO TABS: ORAL_TABLET | ORAL | 0 refills | Status: DC

## 2021-08-18 NOTE — Progress Notes (Signed)
Office Visit Note   Patient: Luis Boone           Date of Birth: March 14, 1951           MRN: [id number] Visit Date: 08/18/2021              Requested by: Prince Solian, MD [address] [place],  [place] [address] PCP: Prince Solian, MD  Chief Complaint  Patient presents with   Lower Back - Pain   Right Leg - Pain      HPI: Patient is a 70 year old gentleman who presents today complaining of right hip and right knee pain.  He does have a history of right-sided low back pain with sciatica.  He feels that unfortunately he has overdone it and had increased activity with lifting and bending a few weeks ago this is been gradually worsening since that late October.  This episode feels exactly like previous episodes of radicular low back pain.  Last episode resolved with oral prednisone he has been taking Tylenol at home without relief he must sleep in a recliner cannot lie flat due to pain.  Has a limping gait.  The right lower extremity feels weak.  Assessment & Plan: Visit Diagnoses:  1. Acute right-sided low back pain with sciatica, sciatica laterality unspecified     Plan: Trial of prednisone burst if we cannot get relief with this consider ESI.  Follow-Up Instructions: Return in about 3 weeks (around 09/08/2021), or if symptoms worsen or fail to improve.   Back Exam   Tenderness  The patient is experiencing no tenderness.   Muscle Strength  The patient has normal back strength.  Tests  Straight leg raise right: positive Straight leg raise left: negative  Other  Gait: abnormal      Patient is alert, oriented, no adenopathy, well-dressed, normal affect, normal respiratory effort.   Imaging: XR Lumbar Spine 2-3 Views  Result Date: 08/18/2021 Radiographs of the lumbar spine unchanged from last viewing, 2/22.  No images are attached to the encounter.  Labs: No results found for: HGBA1C, ESRSEDRATE, CRP, LABURIC, REPTSTATUS, GRAMSTAIN, CULT,  LABORGA   Lab Results  Component Value Date   ALBUMIN 4.2 05/01/2019    No results found for: MG No results found for: VD25OH  No results found for: PREALBUMIN CBC EXTENDED Latest Ref Rng & Units 05/01/2019 01/01/2018  WBC 4.0 - 10.5 K/uL 7.4 7.0  RBC 4.22 - 5.81 MIL/uL 4.37 4.43  HGB 13.0 - 17.0 g/dL 13.9 13.5  HCT 39.0 - 52.0 % 43.1 41.2  PLT 150 - 400 K/uL 177 163     There is no height or weight on file to calculate BMI.  Orders:  Orders Placed This Encounter  Procedures   XR Lumbar Spine 2-3 Views   Meds ordered this encounter  Medications   predniSONE (DELTASONE) 50 MG tablet    Sig: Take one tablet by mouth once daily for 5 days.    Dispense:  5 tablet    Refill:  0     Procedures: No procedures performed  Clinical Data: No additional findings.  ROS:  All other systems negative, except as noted in the HPI. Review of Systems  All other systems reviewed and are negative.  Objective: Vital Signs: There were no vitals taken for this visit.  Specialty Comments:  No specialty comments available.  PMFS History: Patient Active Problem List   Diagnosis Date Noted   PUD (peptic ulcer disease)    Mitral murmur  Hypertension    Effusion, left knee 12/05/2018   Past Medical History:  Diagnosis Date   Allergy    seasonal   Arthritis    Depression    GERD (gastroesophageal reflux disease)    Gout    Heart murmur    heart valve repair   Hiatal hernia    HLD (hyperlipidemia)    Hypertension    Mitral murmur    Obesity    Ocular herpes simplex    PUD (peptic ulcer disease)    Sciatica    Sleep apnea    use CPAP    Family History  Problem Relation Age of Onset   Migraines Mother    Hypertension Mother    Dementia Mother    Cancer Father    Arthritis Father    Heart attack Father    Sleep apnea Sister    Stomach cancer Paternal Grandmother    Colon cancer Neg Hx    Colon polyps Neg Hx    Esophageal cancer Neg Hx    Rectal cancer Neg  Hx     Past Surgical History:  Procedure Laterality Date   COLONOSCOPY  2005,10/02/2014   KNEE ARTHROSCOPY Right 2006   KNEE SURGERY  10/2018   meniscus repair   MITRAL VALVE REPAIR  2004   POLYPECTOMY     Social History   Occupational History   Not on file  Tobacco Use   Smoking status: Never   Smokeless tobacco: Never  Vaping Use   Vaping Use: Never used  Substance and Sexual Activity   Alcohol use: No    Alcohol/week: 0.0 standard drinks   Drug use: No   Sexual activity: Not on file

## 2021-08-27 DIAGNOSIS — M5440 Lumbago with sciatica, unspecified side: Secondary | ICD-10-CM

## 2021-09-01 MED ORDER — PREDNISONE 10 MG PO TABS: 10.0000 mg | ORAL_TABLET | Freq: Every day | ORAL | 0 refills | Status: DC

## 2021-09-08 DIAGNOSIS — B0052 Herpesviral keratitis: Secondary | ICD-10-CM | POA: Diagnosis not present

## 2021-09-08 DIAGNOSIS — H524 Presbyopia: Secondary | ICD-10-CM | POA: Diagnosis not present

## 2021-09-08 DIAGNOSIS — H2513 Age-related nuclear cataract, bilateral: Secondary | ICD-10-CM | POA: Diagnosis not present

## 2021-09-08 DIAGNOSIS — H40052 Ocular hypertension, left eye: Secondary | ICD-10-CM | POA: Diagnosis not present

## 2021-09-09 DIAGNOSIS — B0052 Herpesviral keratitis: Secondary | ICD-10-CM | POA: Diagnosis not present

## 2021-09-09 DIAGNOSIS — H40052 Ocular hypertension, left eye: Secondary | ICD-10-CM | POA: Diagnosis not present

## 2021-09-11 DIAGNOSIS — B0052 Herpesviral keratitis: Secondary | ICD-10-CM | POA: Diagnosis not present

## 2021-09-14 DIAGNOSIS — G4733 Obstructive sleep apnea (adult) (pediatric): Secondary | ICD-10-CM | POA: Diagnosis not present

## 2021-09-20 HISTORY — PX: CATARACT EXTRACTION: SUR2

## 2021-09-22 DIAGNOSIS — B0052 Herpesviral keratitis: Secondary | ICD-10-CM | POA: Diagnosis not present

## 2021-09-29 ENCOUNTER — Other Ambulatory Visit: Payer: Self-pay

## 2021-09-29 ENCOUNTER — Ambulatory Visit: Payer: Self-pay

## 2021-09-29 ENCOUNTER — Ambulatory Visit: Payer: PPO | Admitting: Physical Medicine and Rehabilitation

## 2021-09-29 ENCOUNTER — Encounter: Payer: Self-pay | Admitting: Physical Medicine and Rehabilitation

## 2021-09-29 VITALS — BP 142/76 | HR 67

## 2021-09-29 DIAGNOSIS — M5416 Radiculopathy, lumbar region: Secondary | ICD-10-CM | POA: Diagnosis not present

## 2021-09-29 MED ORDER — METHYLPREDNISOLONE ACETATE 80 MG/ML IJ SUSP
80.0000 mg | Freq: Once | INTRAMUSCULAR | Status: AC
  Administered 2021-09-29: 80 mg

## 2021-09-29 NOTE — Patient Instructions (Signed)

## 2021-09-29 NOTE — Progress Notes (Signed)
Pt state lower back pain that travels down his right leg. Pt state sitting makes the pain worse. Pt state he takes pain meds to help ease his pain.  Numeric Pain Rating Scale and Functional Assessment Average Pain 3   In the last MONTH (on 0-10 scale) has pain interfered with the following?  1. General activity like being  able to carry out your everyday physical activities such as walking, climbing stairs, carrying groceries, or moving a chair?  Rating(10)   +Driver, -BT, -Dye Allergies.

## 2021-09-30 DIAGNOSIS — H40052 Ocular hypertension, left eye: Secondary | ICD-10-CM | POA: Diagnosis not present

## 2021-09-30 DIAGNOSIS — B0052 Herpesviral keratitis: Secondary | ICD-10-CM | POA: Diagnosis not present

## 2021-10-07 DIAGNOSIS — B0052 Herpesviral keratitis: Secondary | ICD-10-CM | POA: Diagnosis not present

## 2021-10-07 DIAGNOSIS — H40052 Ocular hypertension, left eye: Secondary | ICD-10-CM | POA: Diagnosis not present

## 2021-10-19 ENCOUNTER — Encounter: Payer: Self-pay | Admitting: Physical Medicine and Rehabilitation

## 2021-10-19 ENCOUNTER — Other Ambulatory Visit: Payer: Self-pay | Admitting: Physical Medicine and Rehabilitation

## 2021-10-19 DIAGNOSIS — M5416 Radiculopathy, lumbar region: Secondary | ICD-10-CM

## 2021-10-24 NOTE — Procedures (Signed)
Lumbar Epidural Steroid Injection - Interlaminar Approach with Fluoroscopic Guidance  Patient: Luis Boone      Date of Birth: 12-09-1950 MRN: [id number] PCP: Prince Solian, MD      Visit Date: 09/29/2021   Universal Protocol:     Consent Given By: the patient  Position: PRONE  Additional Comments: Vital signs were monitored before and after the procedure. Patient was prepped and draped in the usual sterile fashion. The correct patient, procedure, and site was verified.   Injection Procedure Details:   Procedure diagnoses: Lumbar radiculopathy [M54.16]   Meds Administered:  Meds ordered this encounter  Medications   methylPREDNISolone acetate (DEPO-MEDROL) injection 80 mg     Laterality: Right  Location/Site:  L5-S1  Needle: 3.5 in., 20 ga. Tuohy  Needle Placement: Paramedian epidural  Findings:   -Comments: Excellent flow of contrast into the epidural space.  Procedure Details: Using a paramedian approach from the side mentioned above, the region overlying the inferior lamina was localized under fluoroscopic visualization and the soft tissues overlying this structure were infiltrated with 4 ml. of 1% Lidocaine without Epinephrine. The Tuohy needle was inserted into the epidural space using a paramedian approach.   The epidural space was localized using loss of resistance along with counter oblique bi-planar fluoroscopic views.  After negative aspirate for air, blood, and CSF, a 2 ml. volume of Isovue-250 was injected into the epidural space and the flow of contrast was observed. Radiographs were obtained for documentation purposes.    The injectate was administered into the level noted above.   Additional Comments:  The patient tolerated the procedure well Dressing: 2 x 2 sterile gauze and Band-Aid    Post-procedure details: Patient was observed during the procedure. Post-procedure instructions were reviewed.  Patient left the clinic in stable condition.

## 2021-10-24 NOTE — Progress Notes (Signed)
[name] - 71 y.o. male MRN [id number]  Date of birth: 1951/03/31  Office Visit Note: Visit Date: 09/29/2021 PCP: Prince Solian, MD Referred by: Suzan Slick, NP  Subjective: Chief Complaint  Patient presents with   Lower Back - Pain   Right Leg - Pain   HPI:  Luis Boone is a 71 y.o. male who comes in today at the request of Dondra Prader, [place] for planned Right L5-S1 Lumbar Interlaminar epidural steroid injection with fluoroscopic guidance.  The patient has failed conservative care including home exercise, medications, time and activity modification.  This injection will be diagnostic and hopefully therapeutic.  Please see requesting physician notes for further details and justification.  ROS Otherwise per HPI.  Assessment & Plan: Visit Diagnoses:    ICD-10-CM   1. Lumbar radiculopathy  M54.16 XR C-ARM NO REPORT    Epidural Steroid injection    methylPREDNISolone acetate (DEPO-MEDROL) injection 80 mg      Plan: No additional findings.   Meds & Orders:  Meds ordered this encounter  Medications   methylPREDNISolone acetate (DEPO-MEDROL) injection 80 mg    Orders Placed This Encounter  Procedures   XR C-ARM NO REPORT   Epidural Steroid injection    Follow-up: Return if symptoms worsen or fail to improve.   Procedures: No procedures performed  Lumbar Epidural Steroid Injection - Interlaminar Approach with Fluoroscopic Guidance  Patient: Luis Boone      Date of Birth: 1951-04-06 MRN: [id number] PCP: Prince Solian, MD      Visit Date: 09/29/2021   Universal Protocol:     Consent Given By: the patient  Position: PRONE  Additional Comments: Vital signs were monitored before and after the procedure. Patient was prepped and draped in the usual sterile fashion. The correct patient, procedure, and site was verified.   Injection Procedure Details:   Procedure diagnoses: Lumbar radiculopathy [M54.16]   Meds Administered:  Meds ordered this  encounter  Medications   methylPREDNISolone acetate (DEPO-MEDROL) injection 80 mg     Laterality: Right  Location/Site:  L5-S1  Needle: 3.5 in., 20 ga. Tuohy  Needle Placement: Paramedian epidural  Findings:   -Comments: Excellent flow of contrast into the epidural space.  Procedure Details: Using a paramedian approach from the side mentioned above, the region overlying the inferior lamina was localized under fluoroscopic visualization and the soft tissues overlying this structure were infiltrated with 4 ml. of 1% Lidocaine without Epinephrine. The Tuohy needle was inserted into the epidural space using a paramedian approach.   The epidural space was localized using loss of resistance along with counter oblique bi-planar fluoroscopic views.  After negative aspirate for air, blood, and CSF, a 2 ml. volume of Isovue-250 was injected into the epidural space and the flow of contrast was observed. Radiographs were obtained for documentation purposes.    The injectate was administered into the level noted above.   Additional Comments:  The patient tolerated the procedure well Dressing: 2 x 2 sterile gauze and Band-Aid    Post-procedure details: Patient was observed during the procedure. Post-procedure instructions were reviewed.  Patient left the clinic in stable condition.    Clinical History: No specialty comments available.     Objective:  VS:  HT:     WT:    BMI:      BP:(!) 142/76   HR:67bpm   TEMP: ( )   RESP:  Physical Exam Vitals and nursing note reviewed.  Constitutional:  General: He is not in acute distress.    Appearance: Normal appearance. He is not ill-appearing.  HENT:     Head: Normocephalic and atraumatic.     Right Ear: External ear normal.     Left Ear: External ear normal.     Nose: No congestion.  Eyes:     Extraocular Movements: Extraocular movements intact.  Cardiovascular:     Rate and Rhythm: Normal rate.     Pulses: Normal pulses.   Pulmonary:     Effort: Pulmonary effort is normal. No respiratory distress.  Abdominal:     General: There is no distension.     Palpations: Abdomen is soft.  Musculoskeletal:        General: No tenderness or signs of injury.     Cervical back: Neck supple.     Right lower leg: No edema.     Left lower leg: No edema.     Comments: Patient has good distal strength without clonus.  Skin:    Findings: No erythema or rash.  Neurological:     General: No focal deficit present.     Mental Status: He is alert and oriented to person, place, and time.     Sensory: No sensory deficit.     Motor: No weakness or abnormal muscle tone.     Coordination: Coordination normal.  Psychiatric:        Mood and Affect: Mood normal.        Behavior: Behavior normal.     Imaging: No results found.

## 2021-10-26 ENCOUNTER — Other Ambulatory Visit: Payer: Self-pay | Admitting: Physical Medicine and Rehabilitation

## 2021-10-26 DIAGNOSIS — B0052 Herpesviral keratitis: Secondary | ICD-10-CM | POA: Diagnosis not present

## 2021-10-26 DIAGNOSIS — M5416 Radiculopathy, lumbar region: Secondary | ICD-10-CM

## 2021-10-27 DIAGNOSIS — E785 Hyperlipidemia, unspecified: Secondary | ICD-10-CM | POA: Diagnosis not present

## 2021-10-27 DIAGNOSIS — Z125 Encounter for screening for malignant neoplasm of prostate: Secondary | ICD-10-CM | POA: Diagnosis not present

## 2021-10-27 DIAGNOSIS — M109 Gout, unspecified: Secondary | ICD-10-CM | POA: Diagnosis not present

## 2021-10-27 DIAGNOSIS — N1831 Chronic kidney disease, stage 3a: Secondary | ICD-10-CM | POA: Diagnosis not present

## 2021-11-03 ENCOUNTER — Telehealth: Payer: Self-pay | Admitting: Physical Medicine and Rehabilitation

## 2021-11-03 ENCOUNTER — Ambulatory Visit
Admission: RE | Admit: 2021-11-03 | Discharge: 2021-11-03 | Disposition: A | Discharge status: Home / Self Care | Payer: PPO | Source: Ambulatory Visit | Attending: Physical Medicine and Rehabilitation | Admitting: Physical Medicine and Rehabilitation

## 2021-11-03 DIAGNOSIS — M545 Low back pain, unspecified: Secondary | ICD-10-CM | POA: Diagnosis not present

## 2021-11-03 DIAGNOSIS — Z1212 Encounter for screening for malignant neoplasm of rectum: Secondary | ICD-10-CM | POA: Diagnosis not present

## 2021-11-03 DIAGNOSIS — I129 Hypertensive chronic kidney disease with stage 1 through stage 4 chronic kidney disease, or unspecified chronic kidney disease: Secondary | ICD-10-CM | POA: Diagnosis not present

## 2021-11-03 DIAGNOSIS — M1990 Unspecified osteoarthritis, unspecified site: Secondary | ICD-10-CM | POA: Diagnosis not present

## 2021-11-03 DIAGNOSIS — N1831 Chronic kidney disease, stage 3a: Secondary | ICD-10-CM | POA: Diagnosis not present

## 2021-11-03 DIAGNOSIS — Z23 Encounter for immunization: Secondary | ICD-10-CM | POA: Diagnosis not present

## 2021-11-03 DIAGNOSIS — R82998 Other abnormal findings in urine: Secondary | ICD-10-CM | POA: Diagnosis not present

## 2021-11-03 DIAGNOSIS — M5416 Radiculopathy, lumbar region: Secondary | ICD-10-CM | POA: Diagnosis not present

## 2021-11-03 DIAGNOSIS — E785 Hyperlipidemia, unspecified: Secondary | ICD-10-CM | POA: Diagnosis not present

## 2021-11-03 DIAGNOSIS — M48061 Spinal stenosis, lumbar region without neurogenic claudication: Secondary | ICD-10-CM | POA: Diagnosis not present

## 2021-11-03 DIAGNOSIS — Z0000 Encounter for general adult medical examination without abnormal findings: Secondary | ICD-10-CM | POA: Diagnosis not present

## 2021-11-03 DIAGNOSIS — J309 Allergic rhinitis, unspecified: Secondary | ICD-10-CM | POA: Diagnosis not present

## 2021-11-03 DIAGNOSIS — M109 Gout, unspecified: Secondary | ICD-10-CM | POA: Diagnosis not present

## 2021-11-03 DIAGNOSIS — G4733 Obstructive sleep apnea (adult) (pediatric): Secondary | ICD-10-CM | POA: Diagnosis not present

## 2021-11-03 DIAGNOSIS — Z952 Presence of prosthetic heart valve: Secondary | ICD-10-CM | POA: Diagnosis not present

## 2021-11-03 NOTE — Telephone Encounter (Signed)
Patient called. He would like a call back to schedule with Dr. Ernestina Patches. His call back number is [phone]

## 2021-11-04 ENCOUNTER — Ambulatory Visit: Payer: PPO | Admitting: Physical Medicine and Rehabilitation

## 2021-11-04 ENCOUNTER — Other Ambulatory Visit: Payer: Self-pay

## 2021-11-04 ENCOUNTER — Encounter: Payer: Self-pay | Admitting: Physical Medicine and Rehabilitation

## 2021-11-04 VITALS — BP 109/71 | HR 77

## 2021-11-04 DIAGNOSIS — M5416 Radiculopathy, lumbar region: Secondary | ICD-10-CM | POA: Diagnosis not present

## 2021-11-04 DIAGNOSIS — M47816 Spondylosis without myelopathy or radiculopathy, lumbar region: Secondary | ICD-10-CM

## 2021-11-04 DIAGNOSIS — B0052 Herpesviral keratitis: Secondary | ICD-10-CM | POA: Diagnosis not present

## 2021-11-04 DIAGNOSIS — M4726 Other spondylosis with radiculopathy, lumbar region: Secondary | ICD-10-CM

## 2021-11-04 DIAGNOSIS — M5441 Lumbago with sciatica, right side: Secondary | ICD-10-CM | POA: Diagnosis not present

## 2021-11-04 DIAGNOSIS — G8929 Other chronic pain: Secondary | ICD-10-CM | POA: Diagnosis not present

## 2021-11-04 NOTE — Progress Notes (Signed)
Luis Boone - 71 y.o. male MRN [id number]  Date of birth: 1951/06/14  Office Visit Note: Visit Date: 11/04/2021 PCP: Prince Solian, MD Referred by: Prince Solian, MD  Subjective: Chief Complaint  Patient presents with   Lower Back - Pain   Right Leg - Pain   Left Leg - Pain   HPI: Luis Boone is a 70 y.o. male who comes in today for evaluation of chronic, worsening and severe bilateral lower back pain radiating down right lateral leg to ankle. Patient reports pain has been ongoing for several months. Patients states pain became severe last fall after working outside in yard. Patient reports pain is exacerbated by walking, movement and activity, describes as a constant sharp burning sensation, currently rates as 8 out of 10. Patient reports some relief of pain with physician directed home exercise program, use of ice/heat and Tylenol as needed. Patient did attend formal physical therapy at [facility] several years ago that did help to alleviate his pain. Patients recent lumbar MRI exhibits multi-level facet hypertrophy, narrowing of the subarticular recess at L3-L4. No high grade spinal canal stenosis noted. Patient had right L5-S1 interlaminar epidural steroid injection performed by Dr. Magnus Sinning on 09/29/2021 and reports 100% pain relief for approximately 10 days. Patient states epidural injection allowed him to be more functional and was able to resume daily activities. Patient denies focal weakness, numbness and tingling. Patient denies recent trauma or falls.   Review of Systems  Musculoskeletal:  Positive for back pain.  Neurological:  Negative for tingling, sensory change, focal weakness and weakness.  All other systems reviewed and are negative. Otherwise per HPI.  Assessment & Plan: Visit Diagnoses:    ICD-10-CM   1. Lumbar radiculopathy  M54.16 Ambulatory referral to Physical Medicine Rehab    2. Chronic bilateral low back pain with right-sided sciatica  M54.41     G89.29     3. Other spondylosis with radiculopathy, lumbar region  M47.26     4. Facet hypertrophy of lumbar region  M47.816        Plan: Findings:  Chronic, worsening and severe bilateral lower back pain radiating down right lateral leg to ankle. Patient continues to have excruciating and debilitating pain despite good conservative therapies such as formal physical therapy, home exercise regimen, use of ice/heat, and medications. Patients clinical presentation and exam are consistent with L5 nerve pattern. We feel the next step is to perform a diagnostic and hopefully therapeutic right L4 and L5 transforaminal epidural steroid injection under fluoroscopic guidance. If patient does well with epidural injection we will continue to monitor, if his pain persists we would consider re-grouping with physical therapy and medication management. We feel that we can get patient in quickly for injection. Patient encouraged to remain active and to continue home exercise regimen. No red flag symptoms noted upon exam today.    Meds & Orders: No orders of the defined types were placed in this encounter.   Orders Placed This Encounter  Procedures   Ambulatory referral to Physical Medicine Rehab    Follow-up: Return for Right L4 and L5 transforaminal epidural steroid injection.   Procedures: No procedures performed      Clinical History: No specialty comments available.   He reports that he has never smoked. He has never used smokeless tobacco. No results for input(s): HGBA1C, LABURIC in the last 8760 hours.  Objective:  VS:  HT:     WT:    BMI:  BP:109/71   HR:77bpm   TEMP: ( )   RESP:  Physical Exam Vitals and nursing note reviewed.  HENT:     Head: Normocephalic and atraumatic.     Right Ear: External ear normal.     Left Ear: External ear normal.     Nose: Nose normal.     Mouth/Throat:     Mouth: Mucous membranes are moist.  Eyes:     Extraocular Movements: Extraocular movements  intact.  Cardiovascular:     Rate and Rhythm: Normal rate.     Pulses: Normal pulses.  Pulmonary:     Effort: Pulmonary effort is normal.  Abdominal:     General: Abdomen is flat. There is no distension.  Musculoskeletal:        General: Tenderness present.     Cervical back: Normal range of motion.     Comments: Pt is slow to rise from seated position to standing. Good lumbar range of motion. Strong distal strength without clonus, no pain upon palpation of greater trochanters. Sensation intact bilaterally. Dysesthesias noted to right L5 dermatome. Walks independently, gait steady.   Skin:    General: Skin is warm and dry.     Capillary Refill: Capillary refill takes less than 2 seconds.  Neurological:     General: No focal deficit present.     Mental Status: He is alert and oriented to person, place, and time.  Psychiatric:        Mood and Affect: Mood normal.        Behavior: Behavior normal.    Ortho Exam  Imaging: No results found.  Past Medical/Family/Surgical/Social History: Medications & Allergies reviewed per EMR, new medications updated. Patient Active Problem List   Diagnosis Date Noted   PUD (peptic ulcer disease)    Mitral murmur    Hypertension    Effusion, left knee 12/05/2018   Past Medical History:  Diagnosis Date   Allergy    seasonal   Arthritis    Depression    GERD (gastroesophageal reflux disease)    Gout    Heart murmur    heart valve repair   Hiatal hernia    HLD (hyperlipidemia)    Hypertension    Mitral murmur    Obesity    Ocular herpes simplex    PUD (peptic ulcer disease)    Sciatica    Sleep apnea    use CPAP   Family History  Problem Relation Age of Onset   Migraines Mother    Hypertension Mother    Dementia Mother    Cancer Father    Arthritis Father    Heart attack Father    Sleep apnea Sister    Stomach cancer Paternal Grandmother    Colon cancer Neg Hx    Colon polyps Neg Hx    Esophageal cancer Neg Hx    Rectal  cancer Neg Hx    Past Surgical History:  Procedure Laterality Date   COLONOSCOPY  2005,10/02/2014   KNEE ARTHROSCOPY Right 2006   KNEE SURGERY  10/2018   meniscus repair   MITRAL VALVE REPAIR  2004   POLYPECTOMY     Social History   Occupational History   Not on file  Tobacco Use   Smoking status: Never   Smokeless tobacco: Never  Vaping Use   Vaping Use: Never used  Substance and Sexual Activity   Alcohol use: No    Alcohol/week: 0.0 standard drinks   Drug use: No   Sexual activity:  Not on file

## 2021-11-04 NOTE — Progress Notes (Signed)
Pt state lower back pain that travels down both legs. Pt state laying down and bending makes the pain worse. Pt state he uses heat and ice to help ease his pain.  Numeric Pain Rating Scale and Functional Assessment Average Pain 10 Pain Right Now 6 My pain is constant, sharp, burning, dull, stabbing, tingling, and aching Pain is worse with: bending, some activites, and laying down Pain improves with: heat/ice   In the last MONTH (on 0-10 scale) has pain interfered with the following?  1. General activity like being  able to carry out your everyday physical activities such as walking, climbing stairs, carrying groceries, or moving a chair?  Rating(7)  2. Relation with others like being able to carry out your usual social activities and roles such as  activities at home, at work and in your community. Rating(8)  3. Enjoyment of life such that you have  been bothered by emotional problems such as feeling anxious, depressed or irritable?  Rating(9)

## 2021-11-12 ENCOUNTER — Other Ambulatory Visit: Payer: Self-pay

## 2021-11-12 ENCOUNTER — Encounter: Payer: Self-pay | Admitting: Physical Medicine and Rehabilitation

## 2021-11-12 ENCOUNTER — Ambulatory Visit: Payer: Self-pay

## 2021-11-12 ENCOUNTER — Ambulatory Visit (INDEPENDENT_AMBULATORY_CARE_PROVIDER_SITE_OTHER): Payer: PPO | Admitting: Physical Medicine and Rehabilitation

## 2021-11-12 VITALS — BP 111/65 | HR 94

## 2021-11-12 DIAGNOSIS — M5416 Radiculopathy, lumbar region: Secondary | ICD-10-CM

## 2021-11-12 MED ORDER — METHYLPREDNISOLONE ACETATE 80 MG/ML IJ SUSP
80.0000 mg | Freq: Once | INTRAMUSCULAR | Status: AC
  Administered 2021-11-12: 80 mg

## 2021-11-12 NOTE — Progress Notes (Signed)
Pt state lower back pain that travels down both legs. Pt state laying down and bending makes the pain worse. Pt state he uses heat and ice to help ease his pain.  Numeric Pain Rating Scale and Functional Assessment Average Pain 6   In the last MONTH (on 0-10 scale) has pain interfered with the following?  1. General activity like being  able to carry out your everyday physical activities such as walking, climbing stairs, carrying groceries, or moving a chair?  Rating(10)   +Driver, -BT, -Dye Allergies.

## 2021-11-12 NOTE — Patient Instructions (Signed)

## 2021-11-18 DIAGNOSIS — B0052 Herpesviral keratitis: Secondary | ICD-10-CM | POA: Diagnosis not present

## 2021-11-29 NOTE — Procedures (Signed)
Lumbosacral Transforaminal Epidural Steroid Injection - Sub-Pedicular Approach with Fluoroscopic Guidance  Patient: Luis Boone      Date of Birth: [birth year] MRN: [id number] PCP: Prince Solian, MD      Visit Date: 11/12/2021   Universal Protocol:    Date/Time: 11/12/2021  Consent Given By: the patient  Position: PRONE  Additional Comments: Vital signs were monitored before and after the procedure. Patient was prepped and draped in the usual sterile fashion. The correct patient, procedure, and site was verified.   Injection Procedure Details:   Procedure diagnoses: Lumbar radiculopathy [M54.16]    Meds Administered:  Meds ordered this encounter  Medications   methylPREDNISolone acetate (DEPO-MEDROL) injection 80 mg    Laterality: Right  Location/Site: L4 and L5  Needle:5.0 in., 22 ga.  Short bevel or Quincke spinal needle  Needle Placement: Transforaminal  Findings:    -Comments: Excellent flow of contrast along the nerve, nerve root and into the epidural space.  Procedure Details: After squaring off the end-plates to get a true AP view, the C-arm was positioned so that an oblique view of the foramen as noted above was visualized. The target area is just inferior to the "nose of the scotty dog" or sub pedicular. The soft tissues overlying this structure were infiltrated with 2-3 ml. of 1% Lidocaine without Epinephrine.  The spinal needle was inserted toward the target using a "trajectory" view along the fluoroscope beam.  Under AP and lateral visualization, the needle was advanced so it did not puncture dura and was located close the 6 O'Clock position of the pedical in AP tracterory. Biplanar projections were used to confirm position. Aspiration was confirmed to be negative for CSF and/or blood. A 1-2 ml. volume of Isovue-250 was injected and flow of contrast was noted at each level. Radiographs were obtained for documentation purposes.   After attaining the  desired flow of contrast documented above, a 0.5 to 1.0 ml test dose of 0.25% Marcaine was injected into each respective transforaminal space.  The patient was observed for 90 seconds post injection.  After no sensory deficits were reported, and normal lower extremity motor function was noted,   the above injectate was administered so that equal amounts of the injectate were placed at each foramen (level) into the transforaminal epidural space.   Additional Comments:  The patient tolerated the procedure well Dressing: 2 x 2 sterile gauze and Band-Aid    Post-procedure details: Patient was observed during the procedure. Post-procedure instructions were reviewed.  Patient left the clinic in stable condition.

## 2021-11-29 NOTE — Progress Notes (Signed)
Luis Boone - 71 y.o. male MRN [id number]  Date of birth: 05/01/1951  Office Visit Note: Visit Date: 11/12/2021 PCP: Prince Solian, MD Referred by: Prince Solian, MD  Subjective: Chief Complaint  Patient presents with   Lower Back - Pain   Right Leg - Pain   Left Leg - Pain   HPI:  Luis Boone is a 71 y.o. male who comes in today at the request of Barnet Pall, FNP for planned Right L4-5 and L5-S1 Lumbar Transforaminal epidural steroid injection with fluoroscopic guidance.  The patient has failed conservative care including home exercise, medications, time and activity modification.  This injection will be diagnostic and hopefully therapeutic.  Please see requesting physician notes for further details and justification.  ROS Otherwise per HPI.  Assessment & Plan: Visit Diagnoses:    ICD-10-CM   1. Lumbar radiculopathy  M54.16 XR C-ARM NO REPORT    Epidural Steroid injection    methylPREDNISolone acetate (DEPO-MEDROL) injection 80 mg      Plan: No additional findings.   Meds & Orders:  Meds ordered this encounter  Medications   methylPREDNISolone acetate (DEPO-MEDROL) injection 80 mg    Orders Placed This Encounter  Procedures   XR C-ARM NO REPORT   Epidural Steroid injection    Follow-up: Return for visit to requesting provider as needed.   Procedures: No procedures performed  Lumbosacral Transforaminal Epidural Steroid Injection - Sub-Pedicular Approach with Fluoroscopic Guidance  Patient: Luis Boone      Date of Birth: [birth year] MRN: [id number] PCP: Prince Solian, MD      Visit Date: 11/12/2021   Universal Protocol:    Date/Time: 11/12/2021  Consent Given By: the patient  Position: PRONE  Additional Comments: Vital signs were monitored before and after the procedure. Patient was prepped and draped in the usual sterile fashion. The correct patient, procedure, and site was verified.   Injection Procedure Details:   Procedure  diagnoses: Lumbar radiculopathy [M54.16]    Meds Administered:  Meds ordered this encounter  Medications   methylPREDNISolone acetate (DEPO-MEDROL) injection 80 mg    Laterality: Right  Location/Site: L4 and L5  Needle:5.0 in., 22 ga.  Short bevel or Quincke spinal needle  Needle Placement: Transforaminal  Findings:    -Comments: Excellent flow of contrast along the nerve, nerve root and into the epidural space.  Procedure Details: After squaring off the end-plates to get a true AP view, the C-arm was positioned so that an oblique view of the foramen as noted above was visualized. The target area is just inferior to the "nose of the scotty dog" or sub pedicular. The soft tissues overlying this structure were infiltrated with 2-3 ml. of 1% Lidocaine without Epinephrine.  The spinal needle was inserted toward the target using a "trajectory" view along the fluoroscope beam.  Under AP and lateral visualization, the needle was advanced so it did not puncture dura and was located close the 6 O'Clock position of the pedical in AP tracterory. Biplanar projections were used to confirm position. Aspiration was confirmed to be negative for CSF and/or blood. A 1-2 ml. volume of Isovue-250 was injected and flow of contrast was noted at each level. Radiographs were obtained for documentation purposes.   After attaining the desired flow of contrast documented above, a 0.5 to 1.0 ml test dose of 0.25% Marcaine was injected into each respective transforaminal space.  The patient was observed for 90 seconds post injection.  After no sensory deficits were reported, and  normal lower extremity motor function was noted,   the above injectate was administered so that equal amounts of the injectate were placed at each foramen (level) into the transforaminal epidural space.   Additional Comments:  The patient tolerated the procedure well Dressing: 2 x 2 sterile gauze and Band-Aid    Post-procedure  details: Patient was observed during the procedure. Post-procedure instructions were reviewed.  Patient left the clinic in stable condition.    Clinical History: MRI LUMBAR SPINE WITHOUT CONTRAST   TECHNIQUE: Multiplanar, multisequence MR imaging of the lumbar spine was performed. No intravenous contrast was administered.   COMPARISON:  None.   FINDINGS: Segmentation:  Standard.   Alignment:  No significant listhesis.   Vertebrae: Vertebral body heights are maintained. No marrow edema. No suspicious osseous lesion.   Conus medullaris and cauda equina: Conus extends to the L1-L2 level. Conus and cauda equina appear normal.   Paraspinal and other soft tissues: Probable bilateral renal cysts.   Disc levels: Congenital narrowing of the spinal canal.   T12-L1: Minimal disc bulge and facet arthropathy. No significant canal or foraminal stenosis.   L1-L2: Minimal disc bulge and facet arthropathy. No significant canal or foraminal stenosis.   L2-L3: Facet arthropathy. No significant canal or foraminal stenosis.   L3-L4: Facet arthropathy. No significant canal or foraminal stenosis. Narrowing of the right subarticular recess.   L4-L5: Disc bulge. Facet arthropathy. Mild canal stenosis. Slight effacement subarticular recesses. Minor foraminal stenosis.   L5-S1: Facet arthropathy. No significant canal or foraminal stenosis.   IMPRESSION: Minor degenerative changes superimposed on congenital canal narrowing. No high-grade canal or foraminal stenosis. There is right subarticular recess narrowing at L3-L4.     Electronically Signed   By: Macy Mis M.D.   On: 11/03/2021 11:45     Objective:  VS:  HT:     WT:    BMI:      BP:111/65   HR:94bpm   TEMP: ( )   RESP:  Physical Exam Vitals and nursing note reviewed.  Constitutional:      General: He is not in acute distress.    Appearance: Normal appearance. He is not ill-appearing.  HENT:     Head: Normocephalic  and atraumatic.     Right Ear: External ear normal.     Left Ear: External ear normal.     Nose: No congestion.  Eyes:     Extraocular Movements: Extraocular movements intact.  Cardiovascular:     Rate and Rhythm: Normal rate.     Pulses: Normal pulses.  Pulmonary:     Effort: Pulmonary effort is normal. No respiratory distress.  Abdominal:     General: There is no distension.     Palpations: Abdomen is soft.  Musculoskeletal:        General: No tenderness or signs of injury.     Cervical back: Neck supple.     Right lower leg: No edema.     Left lower leg: No edema.     Comments: Patient has good distal strength without clonus.  Skin:    Findings: No erythema or rash.  Neurological:     General: No focal deficit present.     Mental Status: He is alert and oriented to person, place, and time.     Sensory: No sensory deficit.     Motor: No weakness or abnormal muscle tone.     Coordination: Coordination normal.  Psychiatric:        Mood and Affect: Mood  normal.        Behavior: Behavior normal.     Imaging: No results found.

## 2021-12-02 DIAGNOSIS — B0052 Herpesviral keratitis: Secondary | ICD-10-CM | POA: Diagnosis not present

## 2021-12-14 DIAGNOSIS — G4733 Obstructive sleep apnea (adult) (pediatric): Secondary | ICD-10-CM | POA: Diagnosis not present

## 2021-12-23 DIAGNOSIS — H2513 Age-related nuclear cataract, bilateral: Secondary | ICD-10-CM | POA: Diagnosis not present

## 2021-12-23 DIAGNOSIS — B0052 Herpesviral keratitis: Secondary | ICD-10-CM | POA: Diagnosis not present

## 2022-01-20 DIAGNOSIS — H2513 Age-related nuclear cataract, bilateral: Secondary | ICD-10-CM | POA: Diagnosis not present

## 2022-01-20 DIAGNOSIS — B0052 Herpesviral keratitis: Secondary | ICD-10-CM | POA: Diagnosis not present

## 2022-02-01 DIAGNOSIS — H524 Presbyopia: Secondary | ICD-10-CM | POA: Diagnosis not present

## 2022-02-01 DIAGNOSIS — H1789 Other corneal scars and opacities: Secondary | ICD-10-CM | POA: Diagnosis not present

## 2022-02-01 DIAGNOSIS — H2513 Age-related nuclear cataract, bilateral: Secondary | ICD-10-CM | POA: Diagnosis not present

## 2022-02-01 DIAGNOSIS — B0052 Herpesviral keratitis: Secondary | ICD-10-CM | POA: Diagnosis not present

## 2022-02-18 DIAGNOSIS — H2512 Age-related nuclear cataract, left eye: Secondary | ICD-10-CM | POA: Diagnosis not present

## 2022-02-18 DIAGNOSIS — H25012 Cortical age-related cataract, left eye: Secondary | ICD-10-CM | POA: Diagnosis not present

## 2022-02-18 DIAGNOSIS — H25042 Posterior subcapsular polar age-related cataract, left eye: Secondary | ICD-10-CM | POA: Diagnosis not present

## 2022-02-18 DIAGNOSIS — H25812 Combined forms of age-related cataract, left eye: Secondary | ICD-10-CM | POA: Diagnosis not present

## 2022-02-18 DIAGNOSIS — H269 Unspecified cataract: Secondary | ICD-10-CM | POA: Diagnosis not present

## 2022-03-15 DIAGNOSIS — G4733 Obstructive sleep apnea (adult) (pediatric): Secondary | ICD-10-CM | POA: Diagnosis not present

## 2022-04-08 ENCOUNTER — Encounter: Payer: Self-pay | Admitting: Internal Medicine

## 2022-04-08 ENCOUNTER — Ambulatory Visit: Payer: PPO | Admitting: Cardiology

## 2022-04-08 ENCOUNTER — Encounter: Payer: Self-pay | Admitting: Cardiology

## 2022-04-08 VITALS — BP 130/75 | HR 65 | Temp 98.0°F | Resp 16 | Ht 68.0 in | Wt 238.0 lb

## 2022-04-08 DIAGNOSIS — Z298 Encounter for other specified prophylactic measures: Secondary | ICD-10-CM

## 2022-04-08 DIAGNOSIS — E7800 Pure hypercholesterolemia, unspecified: Secondary | ICD-10-CM | POA: Diagnosis not present

## 2022-04-08 DIAGNOSIS — I10 Essential (primary) hypertension: Secondary | ICD-10-CM

## 2022-04-08 DIAGNOSIS — Z9889 Other specified postprocedural states: Secondary | ICD-10-CM | POA: Diagnosis not present

## 2022-04-08 MED ORDER — AMOXICILLIN 500 MG PO TABS: 500.0000 mg | ORAL_TABLET | Freq: Once | ORAL | 3 refills | Status: DC | PRN

## 2022-04-08 NOTE — Progress Notes (Signed)
Primary Physician/Referring:  Prince Solian, MD  Patient ID: Luis Boone, male    DOB: Nov 01, 1950, 71 y.o.   MRN: [id number]  Chief Complaint  Patient presents with   Coronary Artery Disease        Hypertension   Hyperlipidemia   Follow-up    1 year   HPI:    Luis Boone  is a 71 y.o. Caucasian male with history of mitral valve repair due to flail mitral valve in 2004, hypertension, stage IIIa chronic kidney disease, family history of premature coronary artery disease with father having had coronary disease at age 45, moderate obesity, degenerative joint disease and severe sleep apnea diagnosed in Apr 2018 and follows Dr. Rexene Alberts. 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  He is presently asymptomatic.  He has been losing weight continuously over the past 2 years.  He has lost additional 10 pounds in weight.  He is still battling corneal infection in his right eye, recently had left eye cataract surgery and was noted to have bradycardia and PACs and PVCs during the surgery.  He is asymptomatic with regard to this.  Past Medical History:  Diagnosis Date   Allergy    seasonal   Arthritis    Depression    GERD (gastroesophageal reflux disease)    Gout    Heart murmur    heart valve repair   Hiatal hernia    HLD (hyperlipidemia)    Hypertension    Mitral murmur    Obesity    Ocular herpes simplex    PUD (peptic ulcer disease)    Sciatica    Sleep apnea    use CPAP     Social History   Tobacco Use   Smoking status: Never   Smokeless tobacco: Never  Substance Use Topics   Alcohol use: No    Alcohol/week: 0.0 standard drinks of alcohol   Marital Status: Married  ROS  Review of Systems  Cardiovascular:  Negative for chest pain, dyspnea on exertion and leg swelling.   Objective  Blood pressure 130/75, pulse 65, temperature 98 F (36.7 C), resp. rate 16, height 5' 8"  (1.727 m), weight 238 lb (108 kg), SpO2 98 %. Body mass index is 36.19 kg/m.      04/08/2022   12:58 PM 11/12/2021    2:16 PM 11/04/2021    8:56 AM  Vitals with BMI  Height 5' 8"     Weight 238 lbs    BMI 33.4    Systolic [phone]  Diastolic 75 65 71  Pulse 65 94 77     Physical Exam Constitutional:      Appearance: He is obese.  Cardiovascular:     Rate and Rhythm: Normal rate and regular rhythm.     Pulses: Normal pulses.     Heart sounds: Normal heart sounds. No murmur heard.    No gallop.  Abdominal:     General: Bowel sounds are normal. There is no abdominal bruit.     Palpations: Abdomen is soft.     Tenderness: There is no abdominal tenderness.  Musculoskeletal:     Right lower leg: No edema.     Left lower leg: No edema.      Laboratory examination:   External labs:   Labs 10/27/2021:  Serum glucose 96 mg, BUN 13, creatinine 1.2, EGFR 59 mL, potassium 4.2, LFTs normal.  Hb 12.4/HCT 37.2, platelets 192, normal indicis.  Total cholesterol 113, triglycerides 49, HDL 47,  LDL 56.  Non-HDL cholesterol 66.  Uric acid 5.7, apolipoprotein B 53, TSH normal at 1.52. Medications and allergies   Allergies  Allergen Reactions   Prilosec [Omeprazole] Rash   Vicodin [Hydrocodone-Acetaminophen] Rash   Other Rash    Medication to sedate him for a colonoscopy. 10-02-2014 propofol   Tramadol Rash    Medication list after today's encounter   Current Outpatient Medications:    acetaminophen (TYLENOL) 500 MG tablet, Take 500 mg by mouth every 6 (six) hours as needed., Disp: , Rfl:    allopurinol (ZYLOPRIM) 300 MG tablet, Take 300 mg by mouth daily. , Disp: , Rfl:    amoxicillin (AMOXIL) 500 MG tablet, Take 1 tablet (500 mg total) by mouth once as needed for up to 1 dose (1hour prior to dental procedure)., Disp: 4 tablet, Rfl: 3   aspirin 81 MG tablet, Take 81 mg by mouth daily., Disp: , Rfl:    atorvastatin (LIPITOR) 40 MG tablet, Take 40 mg by mouth daily., Disp: , Rfl:    famotidine (PEPCID) 40 MG tablet, Take 40 mg by mouth 2 (two) times daily., Disp:  , Rfl:    hydroxypropyl methylcellulose / hypromellose (ISOPTO TEARS / GONIOVISC) 2.5 % ophthalmic solution, Place 1 drop into the left eye in the morning and at bedtime., Disp: , Rfl:    loteprednol (LOTEMAX) 0.5 % ophthalmic suspension, Place 2 drops into both eyes 4 (four) times daily., Disp: , Rfl:    Melatonin 10 MG CAPS, Take 10 mg by mouth at bedtime. , Disp: , Rfl:    metoprolol succinate (TOPROL-XL) 25 MG 24 hr tablet, Take 25 mg by mouth daily., Disp: , Rfl:    montelukast (SINGULAIR) 10 MG tablet, Take 10 mg by mouth at bedtime., Disp: , Rfl:    Polyethyl Glycol-Propyl Glycol (SYSTANE) 0.4-0.3 % GEL ophthalmic gel, Place 1 application into the left eye every evening., Disp: , Rfl:    Respiratory Therapy Supplies (CARETOUCH 2 CPAP HOSE HANGER) MISC, by Does not apply route., Disp: , Rfl:    timolol (TIMOPTIC) 0.5 % ophthalmic solution, Place 1 drop into both eyes 2 (two) times daily., Disp: , Rfl:    triamcinolone ointment (KENALOG) 0.1 %, Apply topically., Disp: , Rfl:    valACYclovir (VALTREX) 1000 MG tablet, Take 1,000 mg by mouth daily., Disp: , Rfl:    vitamin C (ASCORBIC ACID) 500 MG tablet, Take 500 mg by mouth daily., Disp: , Rfl:    Radiology:   No results found.  Cardiac Studies:   Lexiscan Myoview stress test 06/25/2019: Lexiscan stress test was performed. Stress EKG is non-diagnostic, as this is pharmacological stress test. SPECT stress and rest images demonstrate medium sized area of mildly decreased uptake in inferior myocardium, more prominent on rest images. With normal wall thickening and wall motion, this area likely represents tissue attenuation. Stress LV EF is normal 67%. Low risk study.   Echocardiogram 05/25/2019: Left ventricle cavity is normal in size. Normal left ventricular wall thickness. Abnormal septal wall motion due to post-operative valve. Normal LV systolic function with EF 55%. Diastolic function not assessed due to h/o mitral valve  repair. Trileaflet aortic valve. Mild (Grade I) aortic regurgitation. S/p mitral valve repair. Mild mitral stenosis. Mean PG 2 mmHg at HR 59 bpm. MVA 1.6 cm2 by pressure half-time method. Mild (Grade I) mitral regurgitation. Mild tricuspid regurgitation. No evidence of pulmonary hypertension. Compared to previous study in 2015, mild mitral stenosis is new.   Treadmill stress test  [03/11/2014]:  Indications: Screening for CAD. Conclusions: Normal Test. Negative for ischemia, The baseline ECG showed NSR,Normal ECG. During exercise there was no ST-T changes of ischemia. The patient exercised according to the Bruce protocol, Total time recorded 5 Min. 24 sec. achieving a max heart rate of 157 which was 100% of MPHR for age and 7.3 METS of work. Symptoms: THR achieved.. Arrhythmia: Occasional PVC at rest. continue primary prevention. Baseline NIBP was 132/82. Peak NIBP was 132/82 MaxSysp was: 178 MaxDiasp was: 82.   Coronary Angiogram  [2004]: no stents placed; done at [facility]  EKG:   EKG 04/08/2022: Normal sinus rhythm at the rate of 65 bpm, left atrial enlargement, leftward axis, PVCs (2).  Compared to 04/08/2021, leftward axis is new and PVCs are noted.  Assessment     ICD-10-CM   1. History of mitral valve repair  Z98.890 EKG 12-Lead    amoxicillin (AMOXIL) 500 MG tablet    2. Benign essential hypertension  I10     3. Pure hypercholesterolemia  E78.00     4. SBE (subacute bacterial endocarditis) prophylaxis candidate  Z29.8 amoxicillin (AMOXIL) 500 MG tablet       Medications Discontinued During This Encounter  Medication Reason   predniSONE (DELTASONE) 10 MG tablet     Meds ordered this encounter  Medications   amoxicillin (AMOXIL) 500 MG tablet    Sig: Take 1 tablet (500 mg total) by mouth once as needed for up to 1 dose (1hour prior to dental procedure).    Dispense:  4 tablet    Refill:  3   Orders Placed This Encounter  Procedures   EKG 12-Lead   Recommendations:    Luis Boone is a 71 y.o. Caucasian male with history of mitral valve repair due to flail mitral valve in 2004, hypertension, stage IIIa chronic kidney disease, family history of premature coronary artery disease with father having had coronary disease at age 87, moderate obesity, degenerative joint disease and severe sleep apnea diagnosed in Apr 2018 and follows Dr. Rexene Alberts. 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  Patient is doing very well today from a cardiac standpoint. His lipids and BP are well controlled and his EKG is normal. He reports no acute symptoms. Patient has been losing weight for the past 2 years, since last office visit he has lost additional 10 pounds in weight.  Blood pressure is well controlled, lipids are at goal, physical examination does not reveal any heart murmur.  EKG does reveal occasional PVCs but otherwise unchanged from prior EKG.  No changes in the medications were done today.  I will see him back in a year.  He does need endocarditis prophylaxis.     Adrian Prows, PA-S 04/08/2022, 1:26 PM Office: [phone]

## 2022-04-15 ENCOUNTER — Encounter: Payer: Self-pay | Admitting: Internal Medicine

## 2022-04-26 DIAGNOSIS — N1831 Chronic kidney disease, stage 3a: Secondary | ICD-10-CM | POA: Diagnosis not present

## 2022-04-26 DIAGNOSIS — M109 Gout, unspecified: Secondary | ICD-10-CM | POA: Diagnosis not present

## 2022-04-26 DIAGNOSIS — J309 Allergic rhinitis, unspecified: Secondary | ICD-10-CM | POA: Diagnosis not present

## 2022-04-26 DIAGNOSIS — G4733 Obstructive sleep apnea (adult) (pediatric): Secondary | ICD-10-CM | POA: Diagnosis not present

## 2022-04-26 DIAGNOSIS — Z952 Presence of prosthetic heart valve: Secondary | ICD-10-CM | POA: Diagnosis not present

## 2022-04-26 DIAGNOSIS — M1990 Unspecified osteoarthritis, unspecified site: Secondary | ICD-10-CM | POA: Diagnosis not present

## 2022-04-26 DIAGNOSIS — B0050 Herpesviral ocular disease, unspecified: Secondary | ICD-10-CM | POA: Diagnosis not present

## 2022-04-26 DIAGNOSIS — M48061 Spinal stenosis, lumbar region without neurogenic claudication: Secondary | ICD-10-CM | POA: Diagnosis not present

## 2022-04-26 DIAGNOSIS — E785 Hyperlipidemia, unspecified: Secondary | ICD-10-CM | POA: Diagnosis not present

## 2022-04-26 DIAGNOSIS — I129 Hypertensive chronic kidney disease with stage 1 through stage 4 chronic kidney disease, or unspecified chronic kidney disease: Secondary | ICD-10-CM | POA: Diagnosis not present

## 2022-04-28 ENCOUNTER — Ambulatory Visit (AMBULATORY_SURGERY_CENTER): Payer: Self-pay

## 2022-04-28 VITALS — Ht 68.0 in | Wt 240.0 lb

## 2022-04-28 DIAGNOSIS — Z86010 Personal history of colonic polyps: Secondary | ICD-10-CM

## 2022-04-28 MED ORDER — NA SULFATE-K SULFATE-MG SULF 17.5-3.13-1.6 GM/177ML PO SOLN: 1.0000 | ORAL | 0 refills | Status: DC

## 2022-04-28 NOTE — Progress Notes (Signed)
No egg or soy allergy known to patient  Pt reports issue with propofol in the past. Chart review reveals rash developed in 2016. Colon 2020 utilized Versed and Fentanyl without issue. Patient denies ever being told they had issues or difficulty with intubation  No FH of Malignant Hyperthermia Pt is not on diet pills Pt is not on  home 02  Pt is not on blood thinners  Pt denies issues with constipation  No A fib or A flutter Have any cardiac testing pending--denied Pt instructed to use [email or web address] or GoodRx for a price reduction on prep

## 2022-05-10 ENCOUNTER — Encounter: Payer: Self-pay | Admitting: Internal Medicine

## 2022-05-18 ENCOUNTER — Encounter: Payer: Self-pay | Admitting: Internal Medicine

## 2022-05-18 ENCOUNTER — Ambulatory Visit (AMBULATORY_SURGERY_CENTER): Payer: PPO | Admitting: Internal Medicine

## 2022-05-18 VITALS — BP 106/60 | HR 66 | Temp 98.6°F | Resp 13 | Ht 68.0 in | Wt 240.0 lb

## 2022-05-18 DIAGNOSIS — Z09 Encounter for follow-up examination after completed treatment for conditions other than malignant neoplasm: Secondary | ICD-10-CM

## 2022-05-18 DIAGNOSIS — D123 Benign neoplasm of transverse colon: Secondary | ICD-10-CM

## 2022-05-18 DIAGNOSIS — G4730 Sleep apnea, unspecified: Secondary | ICD-10-CM | POA: Diagnosis not present

## 2022-05-18 DIAGNOSIS — E669 Obesity, unspecified: Secondary | ICD-10-CM | POA: Diagnosis not present

## 2022-05-18 DIAGNOSIS — F32A Depression, unspecified: Secondary | ICD-10-CM | POA: Diagnosis not present

## 2022-05-18 DIAGNOSIS — Z86010 Personal history of colonic polyps: Secondary | ICD-10-CM

## 2022-05-18 MED ORDER — SODIUM CHLORIDE 0.9 % IV SOLN: 500.0000 mL | Freq: Once | INTRAVENOUS | Status: DC

## 2022-05-18 NOTE — Progress Notes (Signed)
Sedate, gd SR, tolerated procedure well, VSS, report to RN 

## 2022-05-18 NOTE — Progress Notes (Signed)
HISTORY OF PRESENT ILLNESS:  Luis Boone is a 71 y.o. male with a history of multiple adenomatous colon polyps.  Previous examinations 2005, 2016, 2020.  Now for follow-up surveillance.  No complaints  REVIEW OF SYSTEMS:  All non-GI ROS negative. Past Medical History:  Diagnosis Date   Allergy    seasonal   Arthritis    Cataract    Depression    GERD (gastroesophageal reflux disease)    Gout    Heart murmur    heart valve repair   Hiatal hernia    HLD (hyperlipidemia)    Hypertension    Mitral murmur    Obesity    Ocular herpes simplex    PUD (peptic ulcer disease)    Sciatica    Sleep apnea    use CPAP    Past Surgical History:  Procedure Laterality Date   CATARACT EXTRACTION Left 2023   COLONOSCOPY  2005,10/02/2014   KNEE ARTHROSCOPY Right 2006   KNEE SURGERY  10/2018   meniscus repair   MITRAL VALVE REPAIR  2004   POLYPECTOMY      Social History Luis Boone  reports that he has never smoked. He has never used smokeless tobacco. He reports that he does not drink alcohol and does not use drugs.  family history includes Arthritis in his father; Cancer in his father; Dementia in his mother; Heart attack in his father; Hypertension in his mother; Migraines in his mother; Sleep apnea in his sister; Stomach cancer in his paternal grandmother.  Allergies  Allergen Reactions   Prilosec [Omeprazole] Rash   Vicodin [Hydrocodone-Acetaminophen] Rash   Other Rash    Medication to sedate him for a colonoscopy. 10-02-2014 propofol   Tramadol Rash       PHYSICAL EXAMINATION: Vital signs: BP (!) 137/58   Pulse 75   Temp 98.6 F (37 C)   Ht '5\' 8"'$  (1.727 m)   Wt 240 lb (108.9 kg)   SpO2 97%   BMI 36.49 kg/m  General: Well-developed, well-nourished, no acute distress HEENT: Sclerae are anicteric, conjunctiva pink. Oral mucosa intact Lungs: Clear Heart: Regular Abdomen: soft, nontender, nondistended, no obvious ascites, no peritoneal signs, normal bowel sounds.  No organomegaly. Extremities: No edema Psychiatric: alert and oriented x3. Cooperative      ASSESSMENT:  History of adenomatous polyps   PLAN:   Surveillance colonoscopy

## 2022-05-18 NOTE — Op Note (Signed)
[place] Patient Name: Arden Tinoco Procedure Date: 05/18/2022 11:22 AM MRN: [id number] Endoscopist: Docia Chuck. Henrene Pastor , MD Age: 71 Referring MD:  Date of Birth: 30-Dec-1950 Gender: Male Account #: [id number] Procedure:                Colonoscopy with cold snare polypectomy x 2 Indications:              High risk colon cancer surveillance: Personal                            history of multiple (3 or more) adenomas. Previous                            examinations 2005, 2016, 2020 Medicines:                Monitored Anesthesia Care. FENTANYL 50 mcg; VERSED                            4 mg Procedure:                Pre-Anesthesia Assessment:                           - Prior to the procedure, a History and Physical                            was performed, and patient medications and                            allergies were reviewed. The patient's tolerance of                            previous anesthesia was also reviewed. The risks                            and benefits of the procedure and the sedation                            options and risks were discussed with the patient.                            All questions were answered, and informed consent                            was obtained. Prior Anticoagulants: The patient has                            taken no previous anticoagulant or antiplatelet                            agents. ASA Grade Assessment: II - A patient with                            mild systemic disease. After reviewing the risks  and benefits, the patient was deemed in                            satisfactory condition to undergo the procedure.                           After obtaining informed consent, the colonoscope                            was passed under direct vision. Throughout the                            procedure, the patient's blood pressure, pulse, and                            oxygen saturations were  monitored continuously. The                            Olympus CF-HQ190L (Serial# [id number]) Colonoscope was                            introduced through the anus and advanced to the the                            cecum, identified by appendiceal orifice and                            ileocecal valve. The ileocecal valve, appendiceal                            orifice, and rectum were photographed. The quality                            of the bowel preparation was excellent. The                            colonoscopy was performed without difficulty. The                            patient tolerated the procedure well. The bowel                            preparation used was SUPREP via split dose                            instruction. Scope In: 11:29:47 AM Scope Out: 11:48:16 AM Scope Withdrawal Time: 0 hours 14 minutes 29 seconds  Total Procedure Duration: 0 hours 18 minutes 29 seconds  Findings:                 Two polyps were found in the transverse colon. The                            polyps were 3 to 5 mm in size. These polyps were  removed with a cold snare. Resection and retrieval                            were complete. Rare sigmoid diverticulum.                           The exam was otherwise without abnormality on                            direct and retroflexion views. Complications:            No immediate complications. Estimated blood loss:                            None. Estimated Blood Loss:     Estimated blood loss: none. Impression:               - Two 3 to 5 mm polyps in the transverse colon,                            removed with a cold snare. Resected and retrieved.                            Rare sigmoid diverticulum.                           - The examination was otherwise normal on direct                            and retroflexion views. Recommendation:           - Repeat colonoscopy in 5 years for surveillance.                            - Patient has a contact number available for                            emergencies. The signs and symptoms of potential                            delayed complications were discussed with the                            patient. Return to normal activities tomorrow.                            Written discharge instructions were provided to the                            patient.                           - Resume previous diet.                           - Continue present medications.                           -  Await pathology results. Docia Chuck. Henrene Pastor, MD 05/18/2022 11:56:08 AM This report has been signed electronically.

## 2022-05-18 NOTE — Patient Instructions (Signed)
   Handouts on polyps & diverticulosis given to you today  Await pathology results on polyps removed     YOU HAD AN ENDOSCOPIC PROCEDURE TODAY AT [place]:   Refer to the procedure report that was given to you for any specific questions about what was found during the examination.  If the procedure report does not answer your questions, please call your gastroenterologist to clarify.  If you requested that your care partner not be given the details of your procedure findings, then the procedure report has been included in a sealed envelope for you to review at your convenience later.  YOU SHOULD EXPECT: Some feelings of bloating in the abdomen. Passage of more gas than usual.  Walking can help get rid of the air that was put into your GI tract during the procedure and reduce the bloating. If you had a lower endoscopy (such as a colonoscopy or flexible sigmoidoscopy) you may notice spotting of blood in your stool or on the toilet paper. If you underwent a bowel prep for your procedure, you may not have a normal bowel movement for a few days.  Please Note:  You might notice some irritation and congestion in your nose or some drainage.  This is from the oxygen used during your procedure.  There is no need for concern and it should clear up in a day or so.  SYMPTOMS TO REPORT IMMEDIATELY:  Following lower endoscopy (colonoscopy or flexible sigmoidoscopy):  Excessive amounts of blood in the stool  Significant tenderness or worsening of abdominal pains  Swelling of the abdomen that is new, acute  Fever of 100F or higher   For urgent or emergent issues, a gastroenterologist can be reached at any hour by calling [phone]. Do not use MyChart messaging for urgent concerns.    DIET:  We do recommend a small meal at first, but then you may proceed to your regular diet.  Drink plenty of fluids but you should avoid alcoholic beverages for 24 hours.  ACTIVITY:  You should plan  to take it easy for the rest of today and you should NOT DRIVE or use heavy machinery until tomorrow (because of the sedation medicines used during the test).    FOLLOW UP: Our staff will call the number listed on your records the next business day following your procedure.  We will call around 7:15- 8:00 am to check on you and address any questions or concerns that you may have regarding the information given to you following your procedure. If we do not reach you, we will leave a message.  If you develop any symptoms (ie: fever, flu-like symptoms, shortness of breath, cough etc.) before then, please call [phone].  If you test positive for Covid 19 in the 2 weeks post procedure, please call and report this information to [place].    If any biopsies were taken you will be contacted by phone or by letter within the next 1-3 weeks.  Please call us at [phone] if you have not heard about the biopsies in 3 weeks.    SIGNATURES/CONFIDENTIALITY: You and/or your care partner have signed paperwork which will be entered into your electronic medical record.  These signatures attest to the fact that that the information above on your After Visit Summary has been reviewed and is understood.  Full responsibility of the confidentiality of this discharge information lies with you and/or your care-partner.

## 2022-05-18 NOTE — Progress Notes (Deleted)
Sedate, gd SR, tolerated procedure well, VSS, report to RN 

## 2022-05-18 NOTE — Progress Notes (Signed)
Called to room to assist during endoscopic procedure.  Patient ID and intended procedure confirmed with present staff. Received instructions for my participation in the procedure from the performing physician.  

## 2022-05-19 ENCOUNTER — Telehealth: Payer: Self-pay

## 2022-05-19 NOTE — Telephone Encounter (Signed)
  Follow up Call-     05/18/2022   11:02 AM  Call back number  Post procedure Call Back phone  # [phone]  Permission to leave phone message Yes     Patient questions:  Do you have a fever, pain , or abdominal swelling? No. Pain Score  0 *  Have you tolerated food without any problems? Yes.    Have you been able to return to your normal activities? Yes.    Do you have any questions about your discharge instructions: Diet   No. Medications  No. Follow up visit  No.  Do you have questions or concerns about your Care? No.  Actions: * If pain score is 4 or above: No action needed, pain <4.

## 2022-05-22 ENCOUNTER — Encounter: Payer: Self-pay | Admitting: Internal Medicine

## 2022-05-28 DIAGNOSIS — B0052 Herpesviral keratitis: Secondary | ICD-10-CM | POA: Diagnosis not present

## 2022-05-28 DIAGNOSIS — H1789 Other corneal scars and opacities: Secondary | ICD-10-CM | POA: Diagnosis not present

## 2022-06-14 DIAGNOSIS — G4733 Obstructive sleep apnea (adult) (pediatric): Secondary | ICD-10-CM | POA: Diagnosis not present

## 2022-07-29 NOTE — Progress Notes (Unsigned)
PATIENT: Luis Boone DOB: 1951-09-18  REASON FOR VISIT: follow up HISTORY FROM: patient  Chief Complaint  Patient presents with   Follow-up    Rm 19, alone. Cpap follow up. Every day wakes up with real dry mouth.  Humidity level set at highest it can go. Has never tried chin straps.        HISTORY OF PRESENT ILLNESS: Today 08/01/22:  Luis Boone is a 71 year old male with a history of obstructive sleep apnea on CPAP.  He returns today for follow-up.  His download is below.  He states that he wakes up with a dry mouth.  Has his humidity as high as it will go.  He has a full face mask. Has tried biotene but doesn't like the taste.  Using a different mouthwash now.  He has gas heat at home.  His download is below   08/10/21: Luis Boone is a 71 y.o. male with a a history of OSA on CPAP. He returns today for a follow up. His download is attached in a separate note and demonstrates excellent compliance of 100% for days worn and 97% for greater than 4 hours. He has a set pressure setting of 15 cmH2O with an AHI of 6. Patient reports that he wakes up frequently with a dry mouth, encouraged patient to increase the humidity on his machine. He was also told via his PCP to obtain a humidifier for his house since he has gas heating. This may also provide benefit. He complains of his straps wearing out too quickly and that his insurance will only cover a replacement every 6 months. I completed some research with the patient and found his straps on [place] and encouraged him to try those before buying them full price through the DME company.   HISTORY  He saw Ward Givens, nurse practitioner on 08/09/2019, at which time he was compliant with his CPAP.  He was advised to follow-up in 1 year.   Today, 08/11/2020: I reviewed his CPAP compliance data from 07/08/2020 through 08/06/2020, which is a total of 30 days, during which time he used his machine 29 days with percent use days greater than 4 hours at  70%, indicating adequate compliance with an average usage of 5 hours and 41 minutes, residual AHI borderline at 4.5/h, central AHI 1.6/h, pressure of 15 cm, leak on the higher side with a 95th percentile at 23.2 L/min.  He reports overall doing well with his CPAP but sometimes the mask does not stay in place, the headgear stretches out within less than 6 months and he can only get 1 every 6 months per insurance.  He recently paid out of pocket for headgear to replace it.  Sometimes he does not get enough usage when he has to get up early to take his son to the airport.  He has 48 sons, 40 year old got married recently.  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  Patient reports residual left knee pain.  He had arthroscopic surgery and February 2020.  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  He will eventually need bilateral cataract repairs.  Headaches are not significant currently, he does not take Tylenol on a daily or regular basis any longer.  He had chest pain and went to the ER for this last year.  Sadly, he  lost his mother in December 2020.  He lost his father-in-law in December 2019.  REVIEW OF SYSTEMS: Out of a complete 14 system review of symptoms, the patient complains only of the following symptoms, and all other reviewed systems are negative.   ESS 2  ALLERGIES: Allergies  Allergen Reactions   Prilosec [Omeprazole] Rash   Vicodin [Hydrocodone-Acetaminophen] Rash   Other Rash    Medication to sedate him for a colonoscopy. 10-02-2014 propofol   Tramadol Rash    HOME MEDICATIONS: Outpatient Medications Prior to Visit  Medication Sig Dispense Refill   allopurinol (ZYLOPRIM) 300 MG tablet Take 300 mg by mouth daily.      amoxicillin (AMOXIL) 500 MG tablet Take 1 tablet (500 mg total) by mouth once as needed for up to 1 dose (1hour prior to  dental procedure). 4 tablet 3   aspirin 81 MG tablet Take 81 mg by mouth daily.     atorvastatin (LIPITOR) 40 MG tablet Take 40 mg by mouth daily.     cetirizine (ZYRTEC) 10 MG tablet Take 10 mg by mouth daily.     famotidine (PEPCID) 40 MG tablet Take 40 mg by mouth 2 (two) times daily.     fluorometholone (FML) 0.1 % ophthalmic suspension 1 drop every 4 (four) hours.     Melatonin 10 MG CAPS Take 10 mg by mouth at bedtime.      metoprolol succinate (TOPROL-XL) 25 MG 24 hr tablet Take 25 mg by mouth daily.     montelukast (SINGULAIR) 10 MG tablet Take 10 mg by mouth at bedtime.     Respiratory Therapy Supplies (CARETOUCH 2 CPAP HOSE HANGER) MISC by Does not apply route.     valACYclovir (VALTREX) 1000 MG tablet Take 1,000 mg by mouth daily.     vitamin C (ASCORBIC ACID) 500 MG tablet Take 500 mg by mouth daily.     No facility-administered medications prior to visit.    PAST MEDICAL HISTORY: Past Medical History:  Diagnosis Date   Allergy    seasonal   Arthritis    Cataract    Depression    GERD (gastroesophageal reflux disease)    Gout    Heart murmur    heart valve repair   Hiatal hernia    HLD (hyperlipidemia)    Hypertension    Mitral murmur    Obesity    Ocular herpes simplex    PUD (peptic ulcer disease)    Sciatica    Sleep apnea    use CPAP    PAST SURGICAL HISTORY: Past Surgical History:  Procedure Laterality Date   CATARACT EXTRACTION Left 2023   COLONOSCOPY  2005,10/02/2014   KNEE ARTHROSCOPY Right 2006   KNEE SURGERY  10/2018   meniscus repair   MITRAL VALVE REPAIR  2004   POLYPECTOMY      FAMILY HISTORY: Family History  Problem Relation Age of Onset   Migraines Mother    Hypertension Mother    Dementia Mother    Cancer Father    Arthritis Father    Heart attack Father    Sleep apnea Sister    Stomach cancer Paternal Grandmother    Colon cancer Neg Hx    Colon polyps Neg Hx    Esophageal cancer Neg Hx    Rectal cancer Neg Hx     SOCIAL  HISTORY: Social History   Socioeconomic History   Marital status: Married    Spouse name: Not on file   Number of children: 2  Years of education: Not on file   Highest education level: Not on file  Occupational History   Not on file  Tobacco Use   Smoking status: Never   Smokeless tobacco: Never  Vaping Use   Vaping Use: Never used  Substance and Sexual Activity   Alcohol use: No    Alcohol/week: 0.0 standard drinks of alcohol   Drug use: No   Sexual activity: Not on file  Other Topics Concern   Not on file  Social History Narrative   Not on file   Social Determinants of Health   Financial Resource Strain: Not on file  Food Insecurity: Not on file  Transportation Needs: Not on file  Physical Activity: Not on file  Stress: Not on file  Social Connections: Not on file  Intimate Partner Violence: Not on file      PHYSICAL EXAM  Vitals:   08/02/22 0916  BP: 113/67  Pulse: 61  Weight: 247 lb 6.4 oz (112.2 kg)  Height: '5\' 7"'$  (1.702 m)    Body mass index is 38.75 kg/m.  Generalized: Well developed, in no acute distress  Chest: Lungs clear to auscultation bilaterally  Neurological examination  Mentation: Alert oriented to time, place, history taking. Follows all commands speech and language fluent Cranial nerve II-XII: Extraocular movements were full, visual field were full on confrontational test Head turning and shoulder shrug  were normal and symmetric.   Gait and station: Gait is normal.    DIAGNOSTIC DATA (LABS, IMAGING, TESTING) - I reviewed patient records, labs, notes, testing and imaging myself where available.  Lab Results  Component Value Date   WBC 7.4 05/01/2019   HGB 13.9 05/01/2019   HCT 43.1 05/01/2019   MCV 98.6 05/01/2019   PLT 177 05/01/2019      Component Value Date/Time   NA 142 05/01/2019 1246   K 4.7 05/01/2019 1246   CL 109 05/01/2019 1246   CO2 24 05/01/2019 1246   GLUCOSE 111 (H) 05/01/2019 1246   BUN 14 05/01/2019  1246   CREATININE 1.45 (H) 05/01/2019 1246   CALCIUM 9.7 05/01/2019 1246   PROT 7.0 05/01/2019 1323   ALBUMIN 4.2 05/01/2019 1323   AST 21 05/01/2019 1323   ALT 19 05/01/2019 1323   ALKPHOS 74 05/01/2019 1323   BILITOT 0.8 05/01/2019 1323   GFRNONAA 49 (L) 05/01/2019 1246   GFRAA 57 (L) 05/01/2019 1246      ASSESSMENT AND PLAN 71 y.o. year old male  has a past medical history of Allergy, Arthritis, Cataract, Depression, GERD (gastroesophageal reflux disease), Gout, Heart murmur, Hiatal hernia, HLD (hyperlipidemia), Hypertension, Mitral murmur, Obesity, Ocular herpes simplex, PUD (peptic ulcer disease), Sciatica, and Sleep apnea. here with:  OSA on CPAP  - CPAP compliance excellent - Good treatment of AHI  - Encourage patient to use CPAP nightly and > 4 hours each night -Advised that he should get a humidifier for his bedroom to see if this helps with dry mouth.  If he does not offer him any relief may have to ask his DME company to service his machine to ensure that the humidification is working properly - F/U in 1 year or sooner if needed    Ward Givens, MSN, NP-C 08/01/2022, 9:08 AM [facility] [address] [place], [place] [address] [phone]

## 2022-08-02 ENCOUNTER — Ambulatory Visit: Payer: PPO | Admitting: Adult Health

## 2022-08-02 ENCOUNTER — Encounter: Payer: Self-pay | Admitting: Adult Health

## 2022-08-02 VITALS — BP 113/67 | HR 61 | Ht 67.0 in | Wt 247.4 lb

## 2022-08-02 DIAGNOSIS — G4733 Obstructive sleep apnea (adult) (pediatric): Secondary | ICD-10-CM

## 2022-08-02 NOTE — Patient Instructions (Signed)
Your Plan:  Continue CPAP  Try using humidifier in your bedroom    Thank you for coming to see [place] at [facility]. I hope we have been able to provide you high quality care today.  You may receive a patient satisfaction survey over the next few weeks. We would appreciate your feedback and comments so that we may continue to improve ourselves and the health of our patients.

## 2022-08-11 ENCOUNTER — Ambulatory Visit: Payer: PPO | Admitting: Adult Health

## 2022-09-14 DIAGNOSIS — G4733 Obstructive sleep apnea (adult) (pediatric): Secondary | ICD-10-CM | POA: Diagnosis not present

## 2022-10-11 DIAGNOSIS — H1789 Other corneal scars and opacities: Secondary | ICD-10-CM | POA: Diagnosis not present

## 2022-10-11 DIAGNOSIS — H04123 Dry eye syndrome of bilateral lacrimal glands: Secondary | ICD-10-CM | POA: Diagnosis not present

## 2022-10-11 DIAGNOSIS — H0014 Chalazion left upper eyelid: Secondary | ICD-10-CM | POA: Diagnosis not present

## 2022-11-26 DIAGNOSIS — Z125 Encounter for screening for malignant neoplasm of prostate: Secondary | ICD-10-CM | POA: Diagnosis not present

## 2022-11-26 DIAGNOSIS — N1831 Chronic kidney disease, stage 3a: Secondary | ICD-10-CM | POA: Diagnosis not present

## 2022-11-26 DIAGNOSIS — E785 Hyperlipidemia, unspecified: Secondary | ICD-10-CM | POA: Diagnosis not present

## 2022-11-26 DIAGNOSIS — R7989 Other specified abnormal findings of blood chemistry: Secondary | ICD-10-CM | POA: Diagnosis not present

## 2022-11-26 DIAGNOSIS — M109 Gout, unspecified: Secondary | ICD-10-CM | POA: Diagnosis not present

## 2022-12-03 DIAGNOSIS — N1831 Chronic kidney disease, stage 3a: Secondary | ICD-10-CM | POA: Diagnosis not present

## 2022-12-03 DIAGNOSIS — G4733 Obstructive sleep apnea (adult) (pediatric): Secondary | ICD-10-CM | POA: Diagnosis not present

## 2022-12-03 DIAGNOSIS — Z0000 Encounter for general adult medical examination without abnormal findings: Secondary | ICD-10-CM | POA: Diagnosis not present

## 2022-12-03 DIAGNOSIS — Z952 Presence of prosthetic heart valve: Secondary | ICD-10-CM | POA: Diagnosis not present

## 2022-12-03 DIAGNOSIS — M1990 Unspecified osteoarthritis, unspecified site: Secondary | ICD-10-CM | POA: Diagnosis not present

## 2022-12-03 DIAGNOSIS — B0050 Herpesviral ocular disease, unspecified: Secondary | ICD-10-CM | POA: Diagnosis not present

## 2022-12-03 DIAGNOSIS — I129 Hypertensive chronic kidney disease with stage 1 through stage 4 chronic kidney disease, or unspecified chronic kidney disease: Secondary | ICD-10-CM | POA: Diagnosis not present

## 2022-12-03 DIAGNOSIS — M48061 Spinal stenosis, lumbar region without neurogenic claudication: Secondary | ICD-10-CM | POA: Diagnosis not present

## 2022-12-03 DIAGNOSIS — M109 Gout, unspecified: Secondary | ICD-10-CM | POA: Diagnosis not present

## 2022-12-03 DIAGNOSIS — E785 Hyperlipidemia, unspecified: Secondary | ICD-10-CM | POA: Diagnosis not present

## 2022-12-03 DIAGNOSIS — D696 Thrombocytopenia, unspecified: Secondary | ICD-10-CM | POA: Diagnosis not present

## 2022-12-03 DIAGNOSIS — R82998 Other abnormal findings in urine: Secondary | ICD-10-CM | POA: Diagnosis not present

## 2022-12-13 DIAGNOSIS — G4733 Obstructive sleep apnea (adult) (pediatric): Secondary | ICD-10-CM | POA: Diagnosis not present

## 2023-01-13 DIAGNOSIS — H524 Presbyopia: Secondary | ICD-10-CM | POA: Diagnosis not present

## 2023-01-13 DIAGNOSIS — H52203 Unspecified astigmatism, bilateral: Secondary | ICD-10-CM | POA: Diagnosis not present

## 2023-01-13 DIAGNOSIS — H00011 Hordeolum externum right upper eyelid: Secondary | ICD-10-CM | POA: Diagnosis not present

## 2023-01-13 DIAGNOSIS — H1789 Other corneal scars and opacities: Secondary | ICD-10-CM | POA: Diagnosis not present

## 2023-01-13 DIAGNOSIS — H0014 Chalazion left upper eyelid: Secondary | ICD-10-CM | POA: Diagnosis not present

## 2023-01-13 DIAGNOSIS — Z961 Presence of intraocular lens: Secondary | ICD-10-CM | POA: Diagnosis not present

## 2023-01-13 DIAGNOSIS — H2511 Age-related nuclear cataract, right eye: Secondary | ICD-10-CM | POA: Diagnosis not present

## 2023-01-13 DIAGNOSIS — H25011 Cortical age-related cataract, right eye: Secondary | ICD-10-CM | POA: Diagnosis not present

## 2023-03-13 DIAGNOSIS — G4733 Obstructive sleep apnea (adult) (pediatric): Secondary | ICD-10-CM | POA: Diagnosis not present

## 2023-04-13 ENCOUNTER — Ambulatory Visit: Payer: PPO | Admitting: Cardiology

## 2023-05-12 ENCOUNTER — Ambulatory Visit: Payer: PPO | Admitting: Cardiology

## 2023-05-18 DIAGNOSIS — H2511 Age-related nuclear cataract, right eye: Secondary | ICD-10-CM | POA: Diagnosis not present

## 2023-05-18 DIAGNOSIS — H1789 Other corneal scars and opacities: Secondary | ICD-10-CM | POA: Diagnosis not present

## 2023-05-31 ENCOUNTER — Ambulatory Visit: Payer: PPO | Admitting: Cardiology

## 2023-05-31 ENCOUNTER — Encounter: Payer: Self-pay | Admitting: Cardiology

## 2023-05-31 VITALS — BP 125/89 | HR 97 | Resp 16 | Ht 67.0 in | Wt 253.2 lb

## 2023-05-31 DIAGNOSIS — R197 Diarrhea, unspecified: Secondary | ICD-10-CM | POA: Diagnosis not present

## 2023-05-31 DIAGNOSIS — I10 Essential (primary) hypertension: Secondary | ICD-10-CM | POA: Diagnosis not present

## 2023-05-31 DIAGNOSIS — Z9889 Other specified postprocedural states: Secondary | ICD-10-CM

## 2023-05-31 DIAGNOSIS — E7800 Pure hypercholesterolemia, unspecified: Secondary | ICD-10-CM

## 2023-05-31 DIAGNOSIS — I951 Orthostatic hypotension: Secondary | ICD-10-CM

## 2023-05-31 NOTE — Progress Notes (Signed)
Primary Physician/Referring:  Chilton Greathouse, MD  Patient ID: Luis Boone, male    DOB: Feb 22, 1951, 72 y.o.   MRN: [id number]  Chief Complaint  Patient presents with   History of mitral valve repair   Follow-up   HPI:    Luis Boone  is a 72 y.o. 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 Frances Furbish.  Patient presents here for 63-month office visit, states that he has had 1 fall yesterday and throughout the day he was nearly presyncopal as per his wife.  He still has mild dizziness.  Denies chest pain, any neurologic deficits, PND or orthopnea.  No fever or chills, he has not had any recent dental procedure.  He is aware of endocarditis prophylaxis.  States that his diarrhea has improved today.  Past Medical History:  Diagnosis Date   Allergy    seasonal   Arthritis    Cataract    Depression    GERD (gastroesophageal reflux disease)    Gout    Heart murmur    heart valve repair   Hiatal hernia    HLD (hyperlipidemia)    Hypertension    Mitral murmur    Obesity    Ocular herpes simplex    PUD (peptic ulcer disease)    Sciatica    Sleep apnea    use CPAP   Past Surgical History:  Procedure Laterality Date   CATARACT EXTRACTION Left 2023   COLONOSCOPY  2005,10/02/2014   KNEE ARTHROSCOPY Right 2006   KNEE SURGERY  10/2018   meniscus repair   MITRAL VALVE REPAIR  2004   POLYPECTOMY     Family History  Problem Relation Age of Onset   Migraines Mother    Hypertension Mother    Dementia Mother    Cancer Father    Arthritis Father    Heart attack Father    Sleep apnea Sister    Stomach cancer Paternal Grandmother    Colon cancer Neg Hx    Colon polyps Neg Hx    Esophageal cancer Neg Hx    Rectal cancer Neg Hx     Social History   Tobacco Use   Smoking status: Never    Smokeless tobacco: Never  Substance Use Topics   Alcohol use: No    Alcohol/week: 0.0 standard drinks of alcohol   Marital Status: Married  ROS  Review of Systems  Cardiovascular:  Positive for near-syncope. Negative for chest pain, dyspnea on exertion and leg swelling.  Neurological:  Positive for dizziness.   Objective      05/31/2023    3:33 PM 08/02/2022    9:16 AM 05/18/2022   12:19 PM  Vitals with BMI  Height 5\' 7"  5\' 7"    Weight 253 lbs 3 oz 247 lbs 6 oz   BMI 39.65 38.74   Systolic 125 113 253  Diastolic 89 67 60  Pulse 97 61 66   Blood pressure 125/89, pulse 97, resp. rate 16, height 5\' 7"  (1.702 m), weight 253 lb 3.2 oz (114.9 kg), SpO2 94%.  Orthostatic VS for the past 72 hrs (Last 3 readings):  Orthostatic BP Patient Position BP Location Cuff Size Orthostatic Pulse  05/31/23 1544 92/67 Standing Left Arm Large 100  05/31/23 1543 107/61 Sitting Left Arm Large 91  05/31/23 1542 114/65 Supine Left  Arm Large 79     Physical Exam Neck:     Vascular: No carotid bruit or JVD.  Cardiovascular:     Rate and Rhythm: Normal rate and regular rhythm.     Pulses: Intact distal pulses.     Heart sounds: Normal heart sounds. No murmur heard.    No gallop.  Pulmonary:     Effort: Pulmonary effort is normal.     Breath sounds: Normal breath sounds.  Abdominal:     General: Bowel sounds are normal.     Palpations: Abdomen is soft.  Musculoskeletal:     Right lower leg: No edema.     Left lower leg: No edema.    Laboratory examination:   External labs:   Labs 10/27/2021:  Serum glucose 96 mg, BUN 13, creatinine 1.2, EGFR 59 mL, potassium 4.2, LFTs normal.  Hb 12.4/HCT 37.2, platelets 192, normal indicis.  Total cholesterol 113, triglycerides 49, HDL 47, LDL 56.  Uric acid normal at 5.7.  TSH normal at 1.52.  Radiology:    Cardiac Studies:   Lexiscan Myoview stress test 06/25/2019: Lexiscan stress test was performed. Stress EKG is non-diagnostic, as this  is pharmacological stress test. SPECT stress and rest images demonstrate medium sized area of mildly decreased uptake in inferior myocardium, more prominent on rest images. With normal wall thickening and wall motion, this area likely represents tissue attenuation. Stress LV EF is normal 67%. Low risk study.   Echocardiogram 05/25/2019: Left ventricle cavity is normal in size. Normal left ventricular wall thickness. Abnormal septal wall motion due to post-operative valve. Normal LV systolic function with EF 55%. Diastolic function not assessed due to h/o mitral valve repair. Trileaflet aortic valve. Mild (Grade I) aortic regurgitation. S/p mitral valve repair. Mild mitral stenosis. Mean PG 2 mmHg at HR 59 bpm. MVA 1.6 cm2 by pressure half-time method. Mild (Grade I) mitral regurgitation. Mild tricuspid regurgitation. No evidence of pulmonary hypertension. Compared to previous study in 2015, mild mitral stenosis is new.  EKG:   EKG 05/31/2023: Normal sinus rhythm at rate of 84 bpm, normal axis, incomplete right bundle branch block.  Single PAC.  Compared to 04/08/2022, no significant change.    Medications and allergies   Allergies  Allergen Reactions   Prilosec [Omeprazole] Rash   Vicodin [Hydrocodone-Acetaminophen] Rash   Other Rash    Medication to sedate him for a colonoscopy. 10-02-2014 propofol   Tramadol Rash    Medication list   Current Outpatient Medications:    allopurinol (ZYLOPRIM) 300 MG tablet, Take 300 mg by mouth daily. , Disp: , Rfl:    atorvastatin (LIPITOR) 40 MG tablet, Take 40 mg by mouth daily., Disp: , Rfl:    cetirizine (ZYRTEC) 10 MG tablet, Take 10 mg by mouth daily., Disp: , Rfl:    famotidine (PEPCID) 40 MG tablet, Take 40 mg by mouth 2 (two) times daily., Disp: , Rfl:    fluorometholone (FML) 0.1 % ophthalmic suspension, Place 1 drop into the left eye every 4 (four) hours., Disp: , Rfl:    Melatonin 10 MG CAPS, Take 10 mg by mouth at bedtime. , Disp: , Rfl:     metoprolol succinate (TOPROL-XL) 25 MG 24 hr tablet, Take 25 mg by mouth daily., Disp: , Rfl:    montelukast (SINGULAIR) 10 MG tablet, Take 10 mg by mouth at bedtime., Disp: , Rfl:    Respiratory Therapy Supplies (CARETOUCH 2 CPAP HOSE HANGER) MISC, by Does not apply route., Disp: , Rfl:  valACYclovir (VALTREX) 1000 MG tablet, Take 1,000 mg by mouth daily., Disp: , Rfl:    vitamin C (ASCORBIC ACID) 500 MG tablet, Take 500 mg by mouth daily., Disp: , Rfl:    amoxicillin (AMOXIL) 500 MG tablet, Take 1 tablet (500 mg total) by mouth once as needed for up to 1 dose (1hour prior to dental procedure). (Patient not taking: Reported on 05/31/2023), Disp: 4 tablet, Rfl: 3  Assessment     ICD-10-CM   1. History of mitral valve repair  Z98.890 EKG 12-Lead    2. Orthostatic hypotension  I95.1     3. Diarrhea, unspecified type  R19.7     4. Benign essential hypertension  I10     5. Pure hypercholesterolemia  E78.00        Orders Placed This Encounter  Procedures   EKG 12-Lead    No orders of the defined types were placed in this encounter.   Medications Discontinued During This Encounter  Medication Reason   aspirin 81 MG tablet Discontinued by provider     Recommendations:   Luis Boone is a 72 y.o.  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 Frances Furbish.    1. Orthostatic hypotension Patient was severely orthostatic today.  His episodes of fall, marked dizziness and near syncope were clearly related to orthostatic hypotension, patient states that over the past 2 days he has had diarrhea which is watery.  No fever or chills.  Diarrhea has improved and he has had 1 loose stool this morning.  I have advised him to keep himself well-hydrated and to follow-up on orthostatic blood pressure when he  sees Dr. Daleen Bo Avva in 1 week.  2. Diarrhea, unspecified type As dictated above.  3. Benign essential hypertension He is presently on metoprolol succinate 25 mg daily, discussed with the patient and his wife regarding orthostasis and advised them if he remains orthostatic, as they have blood pressure cuff at home, to hold metoprolol until seen by his PCP.  Hydration, fall precautions discussed extensively.  4. History of mitral valve repair Physical examination reveals excellent repair, no murmur, his last echocardiogram in 2020 had revealed good repair of mitral valve.  He is aware of endocarditis prophylaxis. - EKG 12-Lead  5. Pure hypercholesterolemia Reviewed his external labs, lipids are well-controlled.  Overall stable from cardiovascular status, I will see him back on annual basis.  Extensive discussion with patient regarding weight loss.     Luis Decamp, MD, [facility] 05/31/2023, 7:17 PM Office: [phone]

## 2023-06-11 DIAGNOSIS — G4733 Obstructive sleep apnea (adult) (pediatric): Secondary | ICD-10-CM | POA: Diagnosis not present

## 2023-06-13 DIAGNOSIS — J309 Allergic rhinitis, unspecified: Secondary | ICD-10-CM | POA: Diagnosis not present

## 2023-06-13 DIAGNOSIS — N1831 Chronic kidney disease, stage 3a: Secondary | ICD-10-CM | POA: Diagnosis not present

## 2023-06-13 DIAGNOSIS — M1990 Unspecified osteoarthritis, unspecified site: Secondary | ICD-10-CM | POA: Diagnosis not present

## 2023-06-13 DIAGNOSIS — Z952 Presence of prosthetic heart valve: Secondary | ICD-10-CM | POA: Diagnosis not present

## 2023-06-13 DIAGNOSIS — E785 Hyperlipidemia, unspecified: Secondary | ICD-10-CM | POA: Diagnosis not present

## 2023-06-13 DIAGNOSIS — G4733 Obstructive sleep apnea (adult) (pediatric): Secondary | ICD-10-CM | POA: Diagnosis not present

## 2023-06-13 DIAGNOSIS — Z23 Encounter for immunization: Secondary | ICD-10-CM | POA: Diagnosis not present

## 2023-06-13 DIAGNOSIS — M109 Gout, unspecified: Secondary | ICD-10-CM | POA: Diagnosis not present

## 2023-06-13 DIAGNOSIS — M48061 Spinal stenosis, lumbar region without neurogenic claudication: Secondary | ICD-10-CM | POA: Diagnosis not present

## 2023-06-13 DIAGNOSIS — I129 Hypertensive chronic kidney disease with stage 1 through stage 4 chronic kidney disease, or unspecified chronic kidney disease: Secondary | ICD-10-CM | POA: Diagnosis not present

## 2023-06-13 DIAGNOSIS — D696 Thrombocytopenia, unspecified: Secondary | ICD-10-CM | POA: Diagnosis not present

## 2023-08-03 ENCOUNTER — Encounter: Payer: Self-pay | Admitting: Adult Health

## 2023-08-03 ENCOUNTER — Ambulatory Visit: Payer: PPO | Admitting: Adult Health

## 2023-08-03 VITALS — BP 109/65 | HR 59 | Ht 68.0 in | Wt 252.6 lb

## 2023-08-03 DIAGNOSIS — G4733 Obstructive sleep apnea (adult) (pediatric): Secondary | ICD-10-CM

## 2023-08-03 NOTE — Patient Instructions (Signed)
Continue using CPAP nightly and greater than 4 hours each night °If your symptoms worsen or you develop new symptoms please let us know.  ° °

## 2023-09-09 DIAGNOSIS — G4733 Obstructive sleep apnea (adult) (pediatric): Secondary | ICD-10-CM | POA: Diagnosis not present

## 2023-09-12 DIAGNOSIS — H04123 Dry eye syndrome of bilateral lacrimal glands: Secondary | ICD-10-CM | POA: Diagnosis not present

## 2023-09-12 DIAGNOSIS — H1789 Other corneal scars and opacities: Secondary | ICD-10-CM | POA: Diagnosis not present

## 2023-09-12 DIAGNOSIS — H2511 Age-related nuclear cataract, right eye: Secondary | ICD-10-CM | POA: Diagnosis not present

## 2023-12-08 DIAGNOSIS — G4733 Obstructive sleep apnea (adult) (pediatric): Secondary | ICD-10-CM | POA: Diagnosis not present

## 2023-12-16 DIAGNOSIS — N1831 Chronic kidney disease, stage 3a: Secondary | ICD-10-CM | POA: Diagnosis not present

## 2023-12-16 DIAGNOSIS — E785 Hyperlipidemia, unspecified: Secondary | ICD-10-CM | POA: Diagnosis not present

## 2023-12-16 DIAGNOSIS — Z1212 Encounter for screening for malignant neoplasm of rectum: Secondary | ICD-10-CM | POA: Diagnosis not present

## 2023-12-16 DIAGNOSIS — I129 Hypertensive chronic kidney disease with stage 1 through stage 4 chronic kidney disease, or unspecified chronic kidney disease: Secondary | ICD-10-CM | POA: Diagnosis not present

## 2023-12-16 DIAGNOSIS — Z125 Encounter for screening for malignant neoplasm of prostate: Secondary | ICD-10-CM | POA: Diagnosis not present

## 2023-12-16 DIAGNOSIS — M109 Gout, unspecified: Secondary | ICD-10-CM | POA: Diagnosis not present

## 2023-12-23 DIAGNOSIS — J309 Allergic rhinitis, unspecified: Secondary | ICD-10-CM | POA: Diagnosis not present

## 2023-12-23 DIAGNOSIS — M48061 Spinal stenosis, lumbar region without neurogenic claudication: Secondary | ICD-10-CM | POA: Diagnosis not present

## 2023-12-23 DIAGNOSIS — I129 Hypertensive chronic kidney disease with stage 1 through stage 4 chronic kidney disease, or unspecified chronic kidney disease: Secondary | ICD-10-CM | POA: Diagnosis not present

## 2023-12-23 DIAGNOSIS — G4733 Obstructive sleep apnea (adult) (pediatric): Secondary | ICD-10-CM | POA: Diagnosis not present

## 2023-12-23 DIAGNOSIS — M109 Gout, unspecified: Secondary | ICD-10-CM | POA: Diagnosis not present

## 2023-12-23 DIAGNOSIS — E785 Hyperlipidemia, unspecified: Secondary | ICD-10-CM | POA: Diagnosis not present

## 2023-12-23 DIAGNOSIS — Z1331 Encounter for screening for depression: Secondary | ICD-10-CM | POA: Diagnosis not present

## 2023-12-23 DIAGNOSIS — Z1339 Encounter for screening examination for other mental health and behavioral disorders: Secondary | ICD-10-CM | POA: Diagnosis not present

## 2023-12-23 DIAGNOSIS — Z952 Presence of prosthetic heart valve: Secondary | ICD-10-CM | POA: Diagnosis not present

## 2023-12-23 DIAGNOSIS — R82998 Other abnormal findings in urine: Secondary | ICD-10-CM | POA: Diagnosis not present

## 2023-12-23 DIAGNOSIS — D126 Benign neoplasm of colon, unspecified: Secondary | ICD-10-CM | POA: Diagnosis not present

## 2023-12-23 DIAGNOSIS — Z0000 Encounter for general adult medical examination without abnormal findings: Secondary | ICD-10-CM | POA: Diagnosis not present

## 2023-12-23 DIAGNOSIS — M1990 Unspecified osteoarthritis, unspecified site: Secondary | ICD-10-CM | POA: Diagnosis not present

## 2023-12-23 DIAGNOSIS — N1831 Chronic kidney disease, stage 3a: Secondary | ICD-10-CM | POA: Diagnosis not present

## 2024-01-18 ENCOUNTER — Telehealth: Payer: Self-pay | Admitting: Cardiology

## 2024-01-18 DIAGNOSIS — Z9889 Other specified postprocedural states: Secondary | ICD-10-CM

## 2024-01-18 DIAGNOSIS — Z2989 Encounter for other specified prophylactic measures: Secondary | ICD-10-CM

## 2024-01-18 MED ORDER — AMOXICILLIN 500 MG PO TABS: 500.0000 mg | ORAL_TABLET | Freq: Once | ORAL | 3 refills | Status: AC | PRN

## 2024-01-18 NOTE — Telephone Encounter (Signed)
 Pt's medication was sent to pt's pharmacy as requested. Confirmation received.

## 2024-01-18 NOTE — Telephone Encounter (Signed)
*  STAT* If patient is at the pharmacy, call can be transferred to refill team.   1. Which medications need to be refilled? (please list name of each medication and dose if known) amoxicillin  (AMOXIL ) 500 MG tablet    2. Would you like to learn more about the convenience, safety, & potential cost savings by using the [facility]?     3. Are you open to using the [facility] (Type [facility].  ).   4. Which pharmacy/location (including street and city if local pharmacy) is medication to be sent to? [id number] - [place], [place] - [address] AT [facility] [address]    5. Do they need a 30 day or 90 day supply?  30 day    Pt requesting to have this refilled today due to dentist appt.

## 2024-03-01 DIAGNOSIS — H524 Presbyopia: Secondary | ICD-10-CM | POA: Diagnosis not present

## 2024-03-01 DIAGNOSIS — G4733 Obstructive sleep apnea (adult) (pediatric): Secondary | ICD-10-CM | POA: Diagnosis not present

## 2024-03-01 DIAGNOSIS — Z961 Presence of intraocular lens: Secondary | ICD-10-CM | POA: Diagnosis not present

## 2024-03-01 DIAGNOSIS — H2511 Age-related nuclear cataract, right eye: Secondary | ICD-10-CM | POA: Diagnosis not present

## 2024-03-01 DIAGNOSIS — H1789 Other corneal scars and opacities: Secondary | ICD-10-CM | POA: Diagnosis not present

## 2024-03-01 DIAGNOSIS — H02834 Dermatochalasis of left upper eyelid: Secondary | ICD-10-CM | POA: Diagnosis not present

## 2024-03-01 DIAGNOSIS — H25011 Cortical age-related cataract, right eye: Secondary | ICD-10-CM | POA: Diagnosis not present

## 2024-03-01 DIAGNOSIS — H02831 Dermatochalasis of right upper eyelid: Secondary | ICD-10-CM | POA: Diagnosis not present

## 2024-03-01 DIAGNOSIS — H52203 Unspecified astigmatism, bilateral: Secondary | ICD-10-CM | POA: Diagnosis not present

## 2024-05-10 ENCOUNTER — Ambulatory Visit: Payer: Self-pay | Admitting: Cardiology

## 2024-05-20 DIAGNOSIS — G4733 Obstructive sleep apnea (adult) (pediatric): Secondary | ICD-10-CM | POA: Diagnosis not present

## 2024-06-21 ENCOUNTER — Encounter: Payer: Self-pay | Admitting: Cardiology

## 2024-06-21 ENCOUNTER — Ambulatory Visit: Attending: Cardiology | Admitting: Cardiology

## 2024-06-21 VITALS — BP 112/71 | HR 64 | Resp 16 | Ht 68.0 in | Wt 245.4 lb

## 2024-06-21 DIAGNOSIS — E7800 Pure hypercholesterolemia, unspecified: Secondary | ICD-10-CM | POA: Diagnosis not present

## 2024-06-21 DIAGNOSIS — Z2989 Encounter for other specified prophylactic measures: Secondary | ICD-10-CM

## 2024-06-21 DIAGNOSIS — Z9889 Other specified postprocedural states: Secondary | ICD-10-CM

## 2024-06-21 NOTE — Patient Instructions (Signed)
 Medication Instructions:  Your physician recommends that you continue on your current medications as directed. Please refer to the Current Medication list given to you today. *If you need a refill on your cardiac medications before your next appointment, please call your pharmacy*  Lab Work: None ordered If you have labs (blood work) drawn today and your tests are completely normal, you will receive your results only by: MyChart Message (if you have MyChart) OR A paper copy in the mail If you have any lab test that is abnormal or we need to change your treatment, we will call you to review the results.  Testing/Procedures: None ordered  Follow-Up: At [facility], you and your health needs are our priority.  As part of our continuing mission to provide you with exceptional heart care, our providers are all part of one team.  This team includes your primary Cardiologist (physician) and Advanced Practice Providers or APPs (Physician Assistants and Nurse Practitioners) who all work together to provide you with the care you need, when you need it.  Your next appointment:   FOLLOW AS NEEDED   Provider:   Gordy Bergamo, MD   We recommend signing up for the patient portal called MyChart.  Sign up information is provided on this After Visit Summary.  MyChart is used to connect with patients for Virtual Visits (Telemedicine).  Patients are able to view lab/test results, encounter notes, upcoming appointments, etc.  Non-urgent messages can be sent to your provider as well.   To learn more about what you can do with MyChart, go to [email or web address].   Other Instructions

## 2024-06-21 NOTE — Progress Notes (Signed)
 Cardiology Office Note:  .   Date:  06/21/2024  ID:  Luis Boone, DOB 1951-07-07, MRN [id number] PCP: Janey Santos, MD  [facility] Providers Cardiologist:  None   History of Present Illness: .   Luis Boone is a 73 y.o. 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 Buck.    He has had a negative nuclear stress test on 06/25/2019 with no evidence of ischemia with normal ejection fraction of 67% and echocardiogram on 05/25/2019 revealed normal LV systolic function and abnormal septal motion due to postthoracotomy, good repair of mitral valve with mean gradient of 2 mmHg.  On his last office visit a year ago he had complained of orthostatic dizziness, now states that he has not had any further dizziness since stopping antihypertensive medications.  He is asymptomatic.  Cardiac Studies relevent.    Echocardiogram 05/25/2019: Left ventricle cavity is normal in size. Normal left ventricular wall thickness. Abnormal septal wall motion due to post-operative valve. Normal LV systolic function with EF 55%. Diastolic function not assessed due to h/o mitral valve repair. Trileaflet aortic valve. Mild (Grade I) aortic regurgitation. S/p mitral valve repair. Mild mitral stenosis. Mean PG 2 mmHg at HR 59 bpm. MVA 1.6 cm2 by pressure half-time method. Mild (Grade I) mitral regurgitation. Mild tricuspid regurgitation. No evidence of pulmonary hypertension.    Discussed the use of AI scribe software for clinical note transcription with the patient, who gave verbal consent to proceed.  History of Present Illness Luis Boone is a 73 year old male who presents for cardiovascular follow-up.  He underwent mitral valve repair in 2004 for a flail mitral leaflet and has not experienced shortness of breath  since. His last echocardiogram in 2020 showed stable valve function.  He takes metoprolol for cardiac relaxation and atorvastatin 40 mg daily for cholesterol management. Recent labs show an LDL of 51, triglycerides of 86, and total cholesterol of 114.  He experiences imbalance when standing, attributed to arthritis, with no dizziness or syncope. No symptoms of blood pressure drop are present. He uses a CPAP machine for obstructive sleep apnea and occasionally rides a bicycle, considering a stationary bike for exercise.  Labs   Care everywhere/Faxed External Labs:  Labs 12/16/2023:  Serum glucose 91 mg, creatinine 1.4, eGFR 49.7 mL, potassium 4.1, LFTs normal.  Hb 13.4/HCT 39.7, platelets 246.  Total cholesterol 114, triglycerides 86, HDL 46, LDL 51.  TSH normal at 2.29.  ROS  Review of Systems  Cardiovascular:  Negative for chest pain, dyspnea on exertion and leg swelling.   Physical Exam:   VS:  BP 112/71 (BP Location: Left Arm, Patient Position: Sitting, Cuff Size: Large)   Pulse 64   Resp 16   Ht 5' 8 (1.727 m)   Wt 245 lb 6.4 oz (111.3 kg)   SpO2 96%   BMI 37.31 kg/m    Wt Readings from Last 3 Encounters:  06/21/24 245 lb 6.4 oz (111.3 kg)  08/03/23 252 lb 9.6 oz (114.6 kg)  05/31/23 253 lb 3.2 oz (114.9 kg)    BP Readings from Last 3 Encounters:  06/21/24 112/71  08/03/23 109/65  05/31/23 125/89   Physical Exam Constitutional:      Appearance: He is obese.  Neck:     Vascular: No carotid bruit or JVD.  Cardiovascular:     Rate and Rhythm: Normal rate and regular rhythm.     Pulses: Intact distal pulses.     Heart sounds: Normal heart sounds. No murmur heard.    No gallop.  Pulmonary:     Effort: Pulmonary effort is normal.     Breath sounds: Normal breath sounds.  Abdominal:     General: Bowel sounds are normal.     Palpations: Abdomen is soft.  Musculoskeletal:     Right lower leg: No edema.     Left lower leg: No edema.    EKG:    EKG  Interpretation Date/Time:  Thursday June 21 2024 10:42:22 EDT Ventricular Rate:  65 PR Interval:  166 QRS Duration:  96 QT Interval:  418 QTC Calculation: 434 R Axis:   -20  Text Interpretation: Normal sinus rhythm Normal ECG When compared with ECG of 01-May-2019 12:26, No significant change was found Confirmed by Ladona Milan [phone]) on 06/21/2024 3:48:49 PM    ASSESSMENT AND PLAN: .      ICD-10-CM   1. Hypercholesteremia  E78.00 EKG 12-Lead    2. History of mitral valve repair  Z98.890 EKG 12-Lead    3. SBE (subacute bacterial endocarditis) prophylaxis candidate  Z29.89       Assessment and Plan Assessment & Plan Status post mitral valve repair Status post mitral valve repair in 2004 for a flail mitral leaflet. The repair remains stable with no significant issues on the last echocardiogram in 2020. No current symptoms of mitral valve dysfunction, and no murmur detected on auscultation. - Advise taking amoxicillin  2 grams (four 500 mg capsules) one hour before dental procedures to prevent endocarditis. - Monitor for symptoms such as acute shortness of breath or new heart murmur and seek medical attention if he occurs. - No routine follow-up required unless new symptoms develop.  Hypercholesterolemia Hypercholesterolemia is well-controlled with atorvastatin 40 mg daily. Recent lipid panel shows LDL at 51 mg/dL, triglycerides at 86 mg/dL, and total cholesterol at 114 mg/dL, indicating excellent control. - Continue atorvastatin 40 mg daily.  Follow up: PRN  Signed,  Gordy Ladona, MD, [facility] 06/21/2024, 3:49 PM [facility] [address] [place], [place] [address] Phone: [phone]. Fax:  [phone]

## 2024-06-26 DIAGNOSIS — N1831 Chronic kidney disease, stage 3a: Secondary | ICD-10-CM | POA: Diagnosis not present

## 2024-06-26 DIAGNOSIS — M48061 Spinal stenosis, lumbar region without neurogenic claudication: Secondary | ICD-10-CM | POA: Diagnosis not present

## 2024-06-26 DIAGNOSIS — I129 Hypertensive chronic kidney disease with stage 1 through stage 4 chronic kidney disease, or unspecified chronic kidney disease: Secondary | ICD-10-CM | POA: Diagnosis not present

## 2024-06-26 DIAGNOSIS — E785 Hyperlipidemia, unspecified: Secondary | ICD-10-CM | POA: Diagnosis not present

## 2024-06-26 DIAGNOSIS — D126 Benign neoplasm of colon, unspecified: Secondary | ICD-10-CM | POA: Diagnosis not present

## 2024-06-26 DIAGNOSIS — M109 Gout, unspecified: Secondary | ICD-10-CM | POA: Diagnosis not present

## 2024-06-26 DIAGNOSIS — G4733 Obstructive sleep apnea (adult) (pediatric): Secondary | ICD-10-CM | POA: Diagnosis not present

## 2024-06-26 DIAGNOSIS — M1990 Unspecified osteoarthritis, unspecified site: Secondary | ICD-10-CM | POA: Diagnosis not present

## 2024-06-26 DIAGNOSIS — Z6838 Body mass index (BMI) 38.0-38.9, adult: Secondary | ICD-10-CM | POA: Diagnosis not present

## 2024-06-26 DIAGNOSIS — J309 Allergic rhinitis, unspecified: Secondary | ICD-10-CM | POA: Diagnosis not present

## 2024-06-26 DIAGNOSIS — Z23 Encounter for immunization: Secondary | ICD-10-CM | POA: Diagnosis not present

## 2024-08-06 ENCOUNTER — Ambulatory Visit: Payer: PPO | Admitting: Adult Health

## 2024-08-06 ENCOUNTER — Encounter: Payer: Self-pay | Admitting: Adult Health

## 2024-08-06 ENCOUNTER — Telehealth: Payer: Self-pay | Admitting: Adult Health

## 2024-08-06 VITALS — BP 120/72 | HR 69 | Ht 69.0 in | Wt 249.0 lb

## 2024-08-06 DIAGNOSIS — G4733 Obstructive sleep apnea (adult) (pediatric): Secondary | ICD-10-CM | POA: Diagnosis not present

## 2024-08-06 NOTE — Telephone Encounter (Signed)
 HST- HTA pending

## 2024-08-06 NOTE — Progress Notes (Signed)
 PATIENT: Luis Boone DOB: Nov 02, 1950  REASON FOR VISIT: follow up HISTORY FROM: patient  Chief Complaint  Patient presents with   RM 4     Patient is here for alone CPAP follow-up - having issues with the machine only hears his breath going into the machine, but the machine itself does not make any noise  ESS - 0      HISTORY OF PRESENT ILLNESS: Today 08/06/24:  Luis Boone is a 73 y.o. male with a history of OSA on CPAP. Returns today for follow-up.  Overall he feels that the CPAP has been beneficial for him.  Reports that he sleeps fairly well.  He states that he does not feel that the machine is working as well as it used to.  He states that he used to hear the air but now he does not.  He still feels the air but is not as strong as it used to be.  Last sleep study was in 2018.       08/03/23: Luis Boone is a 73 y.o. male with a history of OSA on CPAP. Returns today for follow-up.  Reports that the CPAP is working well for him.  Denies any new issues.  His download is below      08/02/22: Luis Boone is a 73 y.o. male with a a history of OSA on CPAP. He returns today for a follow up. His download is attached in a separate note and demonstrates excellent compliance of 100% for days worn and 97% for greater than 4 hours. He has a set pressure setting of 15 cmH2O with an AHI of 6. Patient reports that he wakes up frequently with a dry mouth, encouraged patient to increase the humidity on his machine. He was also told via his PCP to obtain a humidifier for his house since he has gas heating. This may also provide benefit. He complains of his straps wearing out too quickly and that his insurance will only cover a replacement every 6 months. I completed some research with the patient and found his straps on [employer] and encouraged him to try those before buying them full price through the DME company.   HISTORY  He saw Duwaine Russell, nurse practitioner on 08/09/2019, at which  time he was compliant with his CPAP.  He was advised to follow-up in 1 year.   Today, 08/11/2020: I reviewed his CPAP compliance data from 07/08/2020 through 08/06/2020, which is a total of 30 days, during which time he used his machine 29 days with percent use days greater than 4 hours at 70%, indicating adequate compliance with an average usage of 5 hours and 41 minutes, residual AHI borderline at 4.5/h, central AHI 1.6/h, pressure of 15 cm, leak on the higher side with a 95th percentile at 23.2 L/min.  He reports overall doing well with his CPAP but sometimes the mask does not stay in place, the headgear stretches out within less than 6 months and he can only get 1 every 6 months per insurance.  He recently paid out of pocket for headgear to replace it.  Sometimes he does not get enough usage when he has to get up early to take his son to the airport.  He has 60 sons, 57 year old got married recently.  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  Patient reports residual left knee pain.  He had arthroscopic surgery and February 2020.  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  He will eventually need bilateral cataract repairs.  Headaches are not significant currently, he does not take Tylenol  on a daily or regular basis any longer.  He had chest pain and went to the ER for this last year.  Sadly, he lost his mother in December 2020.  He lost his father-in-law in December 2019.  REVIEW OF SYSTEMS: Out of a complete 14 system review of symptoms, the patient complains only of the following symptoms, and all other reviewed systems are negative.  ESS 0  ALLERGIES: Allergies  Allergen Reactions   Prilosec [Omeprazole] Rash   Vicodin [Hydrocodone-Acetaminophen ] Rash   Other Rash    Medication to sedate him for a colonoscopy. 10-02-2014 propofol   Tramadol  Rash     HOME MEDICATIONS: Outpatient Medications Prior to Visit  Medication Sig Dispense Refill   allopurinol (ZYLOPRIM) 300 MG tablet Take 300 mg by mouth daily.      atorvastatin (LIPITOR) 40 MG tablet Take 40 mg by mouth daily.     cetirizine (ZYRTEC) 10 MG tablet Take 10 mg by mouth daily.     famotidine (PEPCID) 40 MG tablet Take 40 mg by mouth 2 (two) times daily. (Patient taking differently: Take 20 mg by mouth 2 (two) times daily.)     fluorometholone (FML) 0.1 % ophthalmic suspension Place 1 drop into the left eye daily at 2 PM. (Patient taking differently: Place 1 drop into the left eye daily in the afternoon. Every other day)     Melatonin 10 MG CAPS Take 10 mg by mouth at bedtime.      metoprolol succinate (TOPROL-XL) 25 MG 24 hr tablet Take 25 mg by mouth daily.     montelukast (SINGULAIR) 10 MG tablet Take 10 mg by mouth.     Respiratory Therapy Supplies (CARETOUCH 2 CPAP HOSE HANGER) MISC by Does not apply route.     valACYclovir (VALTREX) 1000 MG tablet Take 1,000 mg by mouth daily.     vitamin C (ASCORBIC ACID) 500 MG tablet Take 500 mg by mouth daily.     amoxicillin  (AMOXIL ) 500 MG tablet Take 1 tablet (500 mg total) by mouth once as needed for up to 1 dose (1hour prior to dental procedure). (Patient not taking: Reported on 08/06/2024) 4 tablet 3   No facility-administered medications prior to visit.    PAST MEDICAL HISTORY: Past Medical History:  Diagnosis Date   Allergy    seasonal   Arthritis    Cataract    Depression    GERD (gastroesophageal reflux disease)    Gout    Hiatal hernia    HLD (hyperlipidemia)    Obesity    Ocular herpes simplex    PUD (peptic ulcer disease)    Sciatica    Sleep apnea    use CPAP    PAST SURGICAL HISTORY: Past Surgical History:  Procedure Laterality Date   CATARACT EXTRACTION Left 2023   COLONOSCOPY  2005,10/02/2014   KNEE ARTHROSCOPY Right 2006   KNEE SURGERY  10/2018   meniscus repair   MITRAL VALVE REPAIR  2004    POLYPECTOMY      FAMILY HISTORY: Family History  Problem Relation Age of Onset   Migraines Mother    Hypertension Mother    Dementia Mother    Cancer Father    Arthritis Father    Heart  attack Father    Sleep apnea Sister    Stomach cancer Paternal Grandmother    Colon cancer Neg Hx    Colon polyps Neg Hx    Esophageal cancer Neg Hx    Rectal cancer Neg Hx     SOCIAL HISTORY: Social History   Socioeconomic History   Marital status: Married    Spouse name: Not on file   Number of children: 2   Years of education: Not on file   Highest education level: Not on file  Occupational History   Not on file  Tobacco Use   Smoking status: Never   Smokeless tobacco: Never  Vaping Use   Vaping status: Never Used  Substance and Sexual Activity   Alcohol  use: No    Alcohol /week: 0.0 standard drinks of alcohol    Drug use: No   Sexual activity: Not on file  Other Topics Concern   Not on file  Social History Narrative   1 cup of diet sundrop in the morning or a diet coke in the afternoon   Social Drivers of Corporate Investment Banker Strain: Not on file  Food Insecurity: Not on file  Transportation Needs: Not on file  Physical Activity: Not on file  Stress: Not on file  Social Connections: Not on file  Intimate Partner Violence: Not on file      PHYSICAL EXAM  Vitals:   08/06/24 0955  Pulse: 69  SpO2: 98%  Weight: 249 lb (112.9 kg)  Height: 5' 9 (1.753 m)    Body mass index is 36.77 kg/m.  Generalized: Well developed, in no acute distress  Chest: Lungs clear to auscultation bilaterally  Neurological examination  Mentation: Alert oriented to time, place, history taking. Follows all commands speech and language fluent Cranial nerve II-XII: Facial symmetry noted  DIAGNOSTIC DATA (LABS, IMAGING, TESTING) - I reviewed patient records, labs, notes, testing and imaging myself where available.  Lab Results  Component Value Date   WBC 7.4 05/01/2019   HGB  13.9 05/01/2019   HCT 43.1 05/01/2019   MCV 98.6 05/01/2019   PLT 177 05/01/2019      Component Value Date/Time   NA 142 05/01/2019 1246   K 4.7 05/01/2019 1246   CL 109 05/01/2019 1246   CO2 24 05/01/2019 1246   GLUCOSE 111 (H) 05/01/2019 1246   BUN 14 05/01/2019 1246   CREATININE 1.45 (H) 05/01/2019 1246   CALCIUM 9.7 05/01/2019 1246   PROT 7.0 05/01/2019 1323   ALBUMIN 4.2 05/01/2019 1323   AST 21 05/01/2019 1323   ALT 19 05/01/2019 1323   ALKPHOS 74 05/01/2019 1323   BILITOT 0.8 05/01/2019 1323   GFRNONAA 49 (L) 05/01/2019 1246   GFRAA 57 (L) 05/01/2019 1246      ASSESSMENT AND PLAN 73 y.o. year old male  has a past medical history of Allergy, Arthritis, Cataract, Depression, GERD (gastroesophageal reflux disease), Gout, Hiatal hernia, HLD (hyperlipidemia), Obesity, Ocular herpes simplex, PUD (peptic ulcer disease), Sciatica, and Sleep apnea. here with:  OSA on CPAP  - CPAP compliance excellent - Good treatment of AHI  - Encourage patient to use CPAP nightly and > 4 hours each night -Reviewed his split-night study in 2018 which showed severe sleep apnea.  His machine is over 78 years old.  We will repeat a home sleep test to verify there is been no change in his apnea.  Pending those results we will order him a new machine.  Patient verbalized understanding and is  in agreement with this plan. - F/U after home sleep test    Duwaine Russell, MSN, NP-C 08/06/2024, 10:08 AM [facility] [address], [address] [place], [place] [address] [phone]   The patient's condition requires frequent monitoring and adjustments in the treatment plan, reflecting the ongoing complexity of care.  This provider is the continuing focal point for all needed services for this condition.

## 2024-08-06 NOTE — Patient Instructions (Addendum)
Continue using CPAP nightly and greater than 4 hours each night Home sleep test If your symptoms worsen or you develop new symptoms please let us know.   

## 2024-08-13 NOTE — Telephone Encounter (Signed)
 HST HTA shara: [id number] (Exp. 08/06/24 to 11/04/24)

## 2024-09-05 ENCOUNTER — Ambulatory Visit

## 2024-09-05 DIAGNOSIS — G4733 Obstructive sleep apnea (adult) (pediatric): Secondary | ICD-10-CM | POA: Diagnosis not present

## 2024-09-06 NOTE — Progress Notes (Unsigned)
 SABRA

## 2024-09-10 ENCOUNTER — Ambulatory Visit: Payer: Self-pay | Admitting: Adult Health

## 2024-09-10 DIAGNOSIS — G4733 Obstructive sleep apnea (adult) (pediatric): Secondary | ICD-10-CM

## 2024-09-10 NOTE — Procedures (Signed)
 "           [facility]  HOME SLEEP TEST (Watch PAT) REPORT  STUDY DATE: 09/05/2024  DOB: [birth year]  MRN: [id number]  ORDERING CLINICIAN: True Mar, MD, PhD   REFERRING CLINICIAN: Duwaine Russell, NP  CLINICAL INFORMATION/HISTORY (obtained from visit note dated 08/06/2024): 73 year old male with an underlying medical history of allergies, arthritis, cataracts, depression, reflux disease, gout, sciatica, sleep apnea and obesity who presents for reevaluation of his OSA.  He has been on CPAP of 15 cm with EPR of 3 with mildly suboptimal residual AHI and some difficulty with the machine which is older.  He tolerates treatment and is compliant with it and should be eligible for a new machine.  BMI: 36.8 kg/m  FINDINGS:   Sleep Summary:   Total Recording Time (hours, min): 8 hours, 42 min  Total Sleep Time (hours, min):  7 hours, 12 min  Percent REM (%):    14.2%   Respiratory Indices:   Calculated pAHI (per hour):  21.9/hour         REM pAHI:    29.1/hour       NREM pAHI: 20.7/hour  Central pAHI: 94/hour  Oxygen Saturation Statistics:    Oxygen Saturation (%) Mean: 94%   Minimum oxygen saturation (%):                 88%   O2 Saturation Range (%): 88- 98%    O2 Saturation (minutes) <=88%: 0 min  Pulse Rate Statistics:   Pulse Mean (bpm):    52/min    Pulse Range (39-80/min)   IMPRESSION: OSA (obstructive sleep apnea), moderate  RECOMMENDATION:  This home sleep test demonstrates moderate obstructive sleep apnea with a total AHI of 21.9/hour and O2 nadir of 88%.  Variable snoring was detected, ranging from mild to louder.  Ongoing treatment with a positive airway pressure (PAP) device is recommended. The patient has been on home CPAP therapy at a pressure of 15 cm with EPR of 3.  He should be eligible for a new machine.  An initial AutoPap trial can be initiated with a pressure range of 8 to 16 cm with EPR of 3, mask of choice, sized to  fit.  His residual AHI has been slightly suboptimal on a set pressure of 15 cm, in the mildly elevated range.  Ongoing full compliance with treatment should be encouraged. A full night titration study may be considered to optimize treatment settings, monitor proper oxygen saturations and aid with improvement of tolerance and adherence, if needed down the road. Alternative treatment options may include a dental device through dentistry or orthodontics in selected patients or Inspire (hypoglossal nerve stimulator) in carefully selected patients (meeting inclusion criteria).  Concomitant weight loss is recommended (where clinically appropriate). Please note that untreated obstructive sleep apnea may carry additional perioperative morbidity. Patients with significant obstructive sleep apnea should receive perioperative PAP therapy and the surgeons and particularly the anesthesiologist should be informed of the diagnosis and the severity of the sleep disordered breathing. The patient should be cautioned not to drive, work at heights, or operate dangerous or heavy equipment when tired or sleepy. Review and reiteration of good sleep hygiene measures should be pursued with any patient. Other causes of the patient's symptoms, including circadian rhythm disturbances, an underlying mood disorder, medication effect and/or an underlying medical problem cannot be ruled out based on this test. Clinical correlation is recommended.  The patient and his referring provider will be notified of  the test results. The patient will be seen in follow up in sleep clinic at [facility].  I certify that I have reviewed the raw data recording prior to the issuance of this report in accordance with the standards of the American Academy of Sleep Medicine (AASM).    INTERPRETING PHYSICIAN:   True Mar, MD, PhD Medical Director, [facility]) Diplomat, ABPN (Neurology and Sleep)   [facility] [address] [place], [place] [address] [phone]                 "

## 2024-09-10 NOTE — Telephone Encounter (Signed)
 Spoke to patient . Gave sleep study results Pt kept [facility] as DME Pt states will call back to make f/u visit with Megan,NP after he receives his new cpap machine . Sent orders to [facility] this am

## 2024-09-13 ENCOUNTER — Encounter (HOSPITAL_BASED_OUTPATIENT_CLINIC_OR_DEPARTMENT_OTHER): Payer: Self-pay

## 2024-09-13 ENCOUNTER — Emergency Department (HOSPITAL_BASED_OUTPATIENT_CLINIC_OR_DEPARTMENT_OTHER)
Admission: EM | Admit: 2024-09-13 | Discharge: 2024-09-14 | Disposition: A | Discharge status: Home / Self Care | Attending: Emergency Medicine | Admitting: Emergency Medicine

## 2024-09-13 ENCOUNTER — Emergency Department (HOSPITAL_BASED_OUTPATIENT_CLINIC_OR_DEPARTMENT_OTHER)

## 2024-09-13 DIAGNOSIS — Z79899 Other long term (current) drug therapy: Secondary | ICD-10-CM | POA: Insufficient documentation

## 2024-09-13 DIAGNOSIS — R410 Disorientation, unspecified: Secondary | ICD-10-CM | POA: Diagnosis not present

## 2024-09-13 DIAGNOSIS — J101 Influenza due to other identified influenza virus with other respiratory manifestations: Secondary | ICD-10-CM | POA: Insufficient documentation

## 2024-09-13 DIAGNOSIS — R509 Fever, unspecified: Secondary | ICD-10-CM

## 2024-09-13 LAB — CBC WITH DIFFERENTIAL/PLATELET
Abs Immature Granulocytes: 0.03 K/uL (ref 0.00–0.07)
Basophils Absolute: 0 K/uL (ref 0.0–0.1)
Basophils Relative: 0 %
Eosinophils Absolute: 0 K/uL (ref 0.0–0.5)
Eosinophils Relative: 0 %
HCT: 38.9 % — ABNORMAL LOW (ref 39.0–52.0)
Hemoglobin: 13.3 g/dL (ref 13.0–17.0)
Immature Granulocytes: 0 %
Lymphocytes Relative: 9 %
Lymphs Abs: 0.8 K/uL (ref 0.7–4.0)
MCH: 34.1 pg — ABNORMAL HIGH (ref 26.0–34.0)
MCHC: 34.2 g/dL (ref 30.0–36.0)
MCV: 99.7 fL (ref 80.0–100.0)
Monocytes Absolute: 1 K/uL (ref 0.1–1.0)
Monocytes Relative: 11 %
Neutro Abs: 7.3 K/uL (ref 1.7–7.7)
Neutrophils Relative %: 80 %
Platelets: 131 K/uL — ABNORMAL LOW (ref 150–400)
RBC: 3.9 MIL/uL — ABNORMAL LOW (ref 4.22–5.81)
RDW: 14.3 % (ref 11.5–15.5)
WBC: 9.2 K/uL (ref 4.0–10.5)
nRBC: 0 % (ref 0.0–0.2)

## 2024-09-13 LAB — BASIC METABOLIC PANEL WITH GFR
Anion gap: 12 (ref 5–15)
BUN: 17 mg/dL (ref 8–23)
CO2: 23 mmol/L (ref 22–32)
Calcium: 9.8 mg/dL (ref 8.9–10.3)
Chloride: 104 mmol/L (ref 98–111)
Creatinine, Ser: 1.71 mg/dL — ABNORMAL HIGH (ref 0.61–1.24)
GFR, Estimated: 42 mL/min — ABNORMAL LOW
Glucose, Bld: 112 mg/dL — ABNORMAL HIGH (ref 70–99)
Potassium: 3.6 mmol/L (ref 3.5–5.1)
Sodium: 140 mmol/L (ref 135–145)

## 2024-09-13 LAB — URINALYSIS, ROUTINE W REFLEX MICROSCOPIC
Bilirubin Urine: NEGATIVE
Glucose, UA: NEGATIVE mg/dL
Leukocytes,Ua: NEGATIVE
Nitrite: NEGATIVE
Protein, ur: 30 mg/dL — AB
Specific Gravity, Urine: 1.022 (ref 1.005–1.030)
pH: 5.5 (ref 5.0–8.0)

## 2024-09-13 LAB — LACTIC ACID, PLASMA: Lactic Acid, Venous: 1.1 mmol/L (ref 0.5–1.9)

## 2024-09-13 LAB — RESP PANEL BY RT-PCR (RSV, FLU A&B, COVID)  RVPGX2
Influenza A by PCR: POSITIVE — AB
Influenza B by PCR: NEGATIVE
Resp Syncytial Virus by PCR: NEGATIVE
SARS Coronavirus 2 by RT PCR: NEGATIVE

## 2024-09-13 LAB — TROPONIN T, HIGH SENSITIVITY: Troponin T High Sensitivity: 34 ng/L — ABNORMAL HIGH (ref 0–19)

## 2024-09-13 MED ORDER — OSELTAMIVIR PHOSPHATE 75 MG PO CAPS: 75.0000 mg | ORAL_CAPSULE | Freq: Two times a day (BID) | ORAL | 0 refills | Status: AC

## 2024-09-13 MED ORDER — ONDANSETRON 4 MG PO TBDP: 4.0000 mg | ORAL_TABLET | ORAL | 0 refills | Status: AC | PRN

## 2024-09-13 MED ORDER — LACTATED RINGERS IV BOLUS
1000.0000 mL | Freq: Once | INTRAVENOUS | Status: AC
  Administered 2024-09-13: 1000 mL via INTRAVENOUS

## 2024-09-13 MED ORDER — IBUPROFEN 400 MG PO TABS
400.0000 mg | ORAL_TABLET | Freq: Once | ORAL | Status: AC
  Administered 2024-09-13: 400 mg via ORAL
  Filled 2024-09-13: qty 1

## 2024-09-13 MED ORDER — ACETAMINOPHEN 500 MG PO TABS
1000.0000 mg | ORAL_TABLET | Freq: Once | ORAL | Status: AC
  Administered 2024-09-13: 1000 mg via ORAL
  Filled 2024-09-13: qty 2

## 2024-09-13 MED ORDER — OSELTAMIVIR PHOSPHATE 75 MG PO CAPS
75.0000 mg | ORAL_CAPSULE | Freq: Once | ORAL | Status: AC
  Administered 2024-09-13: 75 mg via ORAL
  Filled 2024-09-13: qty 1

## 2024-09-13 NOTE — Discharge Instructions (Addendum)
 1.  Take extra strength Tylenol  every 6 hours to control fever.  Take ibuprofen  400 mg every 6-8 hours for additional fever, body ache and headache control.  You are prescribed Tamiflu .  This is an antiviral medication to try to lessen the severity of your infection and shorten the course. 2.  Try to stay well-hydrated. 3.  If you have nausea you may take Zofran  as prescribed every 4-6 hours. 4.  Return to the emergency department immediately if you have worsening symptoms such as difficulty breathing, vomiting and cannot take fluids or medications, confusion or other changing symptoms.

## 2024-09-13 NOTE — ED Triage Notes (Signed)
 Pt w wife, advises that he got up this morning, he wasn't feeling well (coughing), when we got home at 1630, he was in the floor & confused. He's not as confused now, but my niece who is a doctor, convinced him he needed to be checked out. Fever 102 at home, wife advises no meds PTA

## 2024-09-13 NOTE — ED Provider Notes (Signed)
 " [place] EMERGENCY DEPARTMENT AT [facility] Provider Note   CSN: [id number] Arrival date & time: 09/13/24  1831     Patient presents with: Fall and URI   Luis Boone is a 73 y.o. male.  {Add pertinent medical, surgical, social history, OB history to HPI:32947} HPI Patient started getting sick yesterday with productive cough and then fever.  Today he felt hot to the touch and was still coughing so he skipped going to a family function.  When his family members came home they found him on the floor, confused and very hot to the touch.  His temperature was 102.  Denies he had any injury.  He denies any areas of pain but feels diffusely achy.  Family members did not suspect injury.  After they got home he became less confused.  He reports now he just feels achy fatigued and weak.  He continues to have a cough.  No chest pain.  No shortness of breath.    Prior to Admission medications  Medication Sig Start Date End Date Taking? Authorizing Provider  allopurinol (ZYLOPRIM) 300 MG tablet Take 300 mg by mouth daily.  02/06/19   [provider]  amoxicillin  (AMOXIL ) 500 MG tablet Take 1 tablet (500 mg total) by mouth once as needed for up to 1 dose (1hour prior to dental procedure). Patient not taking: Reported on 08/06/2024 01/18/24   Ladona Heinz, MD  atorvastatin (LIPITOR) 40 MG tablet Take 40 mg by mouth daily.    [provider]  cetirizine (ZYRTEC) 10 MG tablet Take 10 mg by mouth daily.    [provider]  famotidine (PEPCID) 40 MG tablet Take 40 mg by mouth 2 (two) times daily. Patient taking differently: Take 20 mg by mouth 2 (two) times daily.    [provider]  fluorometholone (FML) 0.1 % ophthalmic suspension Place 1 drop into the left eye daily at 2 PM. Patient taking differently: Place 1 drop into the left eye daily in the afternoon. Every other day    [provider]  Melatonin 10 MG CAPS Take 10 mg by mouth at bedtime.      [provider]  metoprolol succinate (TOPROL-XL) 25 MG 24 hr tablet Take 25 mg by mouth daily.    [provider]  montelukast (SINGULAIR) 10 MG tablet Take 10 mg by mouth.    [provider]  Respiratory Therapy Supplies (CARETOUCH 2 CPAP HOSE HANGER) MISC by Does not apply route.    [provider]  valACYclovir (VALTREX) 1000 MG tablet Take 1,000 mg by mouth daily. 01/28/21   [provider]  vitamin C (ASCORBIC ACID) 500 MG tablet Take 500 mg by mouth daily.    [provider]    Allergies: Prilosec [omeprazole], Vicodin [hydrocodone-acetaminophen ], Other, and Tramadol     Review of Systems  Updated Vital Signs BP 109/67   Pulse (!) 106   Temp (!) 102.9 F (39.4 C) (Oral)   Resp 20   SpO2 95%   Physical Exam Constitutional:      Comments: Patient is alert.  No respiratory distress mildly ill in appearance.  HENT:     Head: Normocephalic and atraumatic.     Mouth/Throat:     Pharynx: Oropharynx is clear.  Cardiovascular:     Rate and Rhythm: Normal rate and regular rhythm.  Pulmonary:     Effort: Pulmonary effort is normal.     Breath sounds: Normal breath sounds.  Abdominal:  General: There is no distension.     Palpations: Abdomen is soft.     Tenderness: There is no abdominal tenderness. There is no guarding.  Musculoskeletal:        General: No swelling, tenderness or signs of injury. Normal range of motion.     Right lower leg: No edema.     Left lower leg: No edema.  Skin:    General: Skin is warm and dry.  Neurological:     Comments: No focal deficit at this time.  Patient is situationally oriented but is deferring to his wife for more detailed history.  He follows commands appropriately.  Speech is clear and normal cognitive function.  He is moving all 4 extremities appropriately in coordinated fashion.  No focal deficits.     (all labs ordered are listed, but only abnormal results are displayed) Labs  Reviewed  RESP PANEL BY RT-PCR (RSV, FLU A&B, COVID)  RVPGX2 - Abnormal; Notable for the following components:      Result Value   Influenza A by PCR POSITIVE (*)    All other components within normal limits  BASIC METABOLIC PANEL WITH GFR - Abnormal; Notable for the following components:   Glucose, Bld 112 (*)    Creatinine, Ser 1.71 (*)    GFR, Estimated 42 (*)    All other components within normal limits  CBC WITH DIFFERENTIAL/PLATELET - Abnormal; Notable for the following components:   RBC 3.90 (*)    HCT 38.9 (*)    MCH 34.1 (*)    Platelets 131 (*)    All other components within normal limits  TROPONIN T, HIGH SENSITIVITY - Abnormal; Notable for the following components:   Troponin T High Sensitivity 34 (*)    All other components within normal limits  LACTIC ACID, PLASMA  LACTIC ACID, PLASMA  URINALYSIS, ROUTINE W REFLEX MICROSCOPIC  TROPONIN T, HIGH SENSITIVITY    EKG: EKG Interpretation Date/Time:  Thursday September 13 2024 22:15:14 EST Ventricular Rate:  96 PR Interval:  163 QRS Duration:  115 QT Interval:  353 QTC Calculation: 447 R Axis:   -37  Text Interpretation: Sinus rhythm Nonspecific IVCD with LAD no sig change from previous Confirmed by Armenta Canning [phone]) on 09/13/2024 11:02:47 PM  Radiology: [facility] Chest Port 1 View Result Date: 09/13/2024 EXAM: 1 VIEW(S) XRAY OF THE CHEST 09/13/2024 09:54:00 PM COMPARISON: Chest PA lat 05/01/2019. CLINICAL HISTORY: influenza productive cough FINDINGS: LUNGS AND PLEURA: No focal pulmonary opacity. No pleural effusion. No pneumothorax. HEART AND MEDIASTINUM: MVR. The cardiac size is normal. Mild aortic tortuosity and stable mediastinum. BONES AND SOFT TISSUES: Sternotomy sutures. No acute osseous abnormality. IMPRESSION: 1. No acute process. Electronically signed by: Francis Quam MD 09/13/2024 10:06 PM EST RP Workstation: [id number]    {Document cardiac monitor, telemetry assessment procedure when  appropriate:32947} Procedures   Medications Ordered in the ED  lactated ringers  bolus 1,000 mL (1,000 mLs Intravenous New Bag/Given 09/13/24 2204)  acetaminophen  (TYLENOL ) tablet 1,000 mg (1,000 mg Oral Given 09/13/24 2157)  ibuprofen  (ADVIL ) tablet 400 mg (400 mg Oral Given 09/13/24 2157)  oseltamivir  (TAMIFLU ) capsule 75 mg (75 mg Oral Given 09/13/24 2158)      {Click here for ABCD2, HEART and other calculators REFRESH Note before signing:1}                              Medical Decision Making Amount and/or Complexity of Data Reviewed Labs: ordered. Radiology:  ordered.  Risk OTC drugs. Prescription drug management.   Patient presents as outlined.  He is febrile on arrival to 102.9.  He tested positive for influenza A.  Symptoms are of 2 days at this point.  Coughing yesterday then fever and weakness and confusion today.  Given patient's age plus confusion with influenza we will proceed with basic lab work, chest x-ray and rehydration.  Will give Tamiflu , acetaminophen  and ibuprofen .  {Document critical care time when appropriate  Document review of labs and clinical decision tools ie CHADS2VASC2, etc  Document your independent review of radiology images and any outside records  Document your discussion with family members, caretakers and with consultants  Document social determinants of health affecting pt's care  Document your decision making why or why not admission, treatments were needed:32947:::1}   Final diagnoses:  None    ED Discharge Orders     None        "

## 2024-09-14 LAB — TROPONIN T, HIGH SENSITIVITY: Troponin T High Sensitivity: 36 ng/L — ABNORMAL HIGH (ref 0–19)

## 2024-10-08 ENCOUNTER — Telehealth: Payer: Self-pay | Admitting: Adult Health

## 2024-10-08 NOTE — Telephone Encounter (Signed)
 Pt was scheduled for his initial CPAP on (11/09/24) Pt was informed to bring machine and power cord to the appointment.   DME and between dates are in pt's SnapShot.

## 2024-11-09 ENCOUNTER — Ambulatory Visit: Admitting: Adult Health

## 2025-08-07 ENCOUNTER — Ambulatory Visit: Admitting: Adult Health
